# Patient Record
Sex: Male | Born: 1980 | Race: Black or African American | Hispanic: No | Marital: Married | Smoking: Never smoker
Health system: Southern US, Community
[De-identification: ages and names within clinical notes are randomized; demographics above are authoritative.]

## PROBLEM LIST (undated history)

## (undated) DIAGNOSIS — I10 Essential (primary) hypertension: Secondary | ICD-10-CM

## (undated) HISTORY — PX: KNEE SURGERY: SHX244

---

## 2008-11-12 ENCOUNTER — Emergency Department (HOSPITAL_BASED_OUTPATIENT_CLINIC_OR_DEPARTMENT_OTHER): Admission: EM | Admit: 2008-11-12 | Discharge: 2008-11-12 | Payer: Self-pay | Admitting: Emergency Medicine

## 2008-11-12 ENCOUNTER — Ambulatory Visit: Payer: Self-pay | Admitting: Diagnostic Radiology

## 2012-01-26 ENCOUNTER — Emergency Department (HOSPITAL_BASED_OUTPATIENT_CLINIC_OR_DEPARTMENT_OTHER)
Admission: EM | Admit: 2012-01-26 | Discharge: 2012-01-26 | Disposition: A | Discharge status: Home / Self Care | Payer: Self-pay | Attending: Emergency Medicine | Admitting: Emergency Medicine

## 2012-01-26 ENCOUNTER — Encounter (HOSPITAL_BASED_OUTPATIENT_CLINIC_OR_DEPARTMENT_OTHER): Payer: Self-pay

## 2012-01-26 DIAGNOSIS — M25469 Effusion, unspecified knee: Secondary | ICD-10-CM | POA: Insufficient documentation

## 2012-01-26 MED ORDER — IBUPROFEN 600 MG PO TABS: 600.0000 mg | ORAL_TABLET | Freq: Four times a day (QID) | ORAL | Status: DC | PRN

## 2012-01-26 MED ORDER — IBUPROFEN 400 MG PO TABS
600.0000 mg | ORAL_TABLET | Freq: Once | ORAL | Status: AC
  Administered 2012-01-26: 600 mg via ORAL
  Filled 2012-01-26: qty 1

## 2012-01-26 NOTE — ED Provider Notes (Signed)
Medical screening examination/treatment/procedure(s) were performed by non-physician practitioner and as supervising physician I was immediately available for consultation/collaboration.   Mikkel Charrette, MD 01/26/12 2346 

## 2012-01-26 NOTE — ED Notes (Signed)
C/o swelling to left knee after running-denies specific injury

## 2012-01-26 NOTE — ED Notes (Signed)
MD at bedside. 

## 2012-01-26 NOTE — ED Provider Notes (Signed)
History     CSN: [id number]  Arrival date & time 01/26/12  1815   First MD Initiated Contact with Patient 01/26/12 1830      Chief Complaint  Patient presents with  . Joint Swelling    (Consider location/radiation/quality/duration/timing/severity/associated sxs/prior treatment) HPI Comments: Patient presents with complaint of left knee swelling and minimal pain starting 2 days ago. Patient has a history of knee surgeries in the past. Patient states that he recently returned to jogging 2 days ago and noticed swelling gradually getting worse over the past 2 days. No treatments prior to arrival. Patient is ambulatory without much pain. Onset acute. Course is gradually worsening. Nothing makes symptoms better or worse. Patient denies numbness, weakness, or tingling in his leg.  The history is provided by the patient.    History reviewed. No pertinent past medical history.  History reviewed. No pertinent past surgical history.  No family history on file.  History  Substance Use Topics  . Smoking status: Never Smoker   . Smokeless tobacco: Not on file  . Alcohol Use: Yes      Review of Systems  Constitutional: Negative for activity change.  HENT: Negative for neck pain.   Musculoskeletal: Positive for joint swelling, arthralgias and gait problem. Negative for back pain.  Skin: Negative for wound.  Neurological: Negative for weakness and numbness.    Allergies  Other  Home Medications   Current Outpatient Rx  Name  Route  Sig  Dispense  Refill  . IBUPROFEN 600 MG PO TABS   Oral   Take 1 tablet (600 mg total) by mouth every 6 (six) hours as needed for pain.   20 tablet   0     BP 173/84  Pulse 77  Temp 98.6 F (37 C) (Oral)  Resp 16  Ht 5\' 8"  (1.727 m)  Wt 255 lb (115.667 kg)  BMI 38.77 kg/m2  SpO2 98%  Physical Exam  Nursing note and vitals reviewed. Constitutional: He appears well-developed and well-nourished.  HENT:  Head: Normocephalic and  atraumatic.  Eyes: Conjunctivae normal are normal.  Neck: Normal range of motion. Neck supple.  Cardiovascular: Normal pulses.   Musculoskeletal: He exhibits tenderness. He exhibits no edema.       Left hip: Normal.       Left knee: He exhibits effusion (small). He exhibits normal range of motion. no tenderness found.       Left ankle: Normal. no tenderness.  Neurological: He is alert. No sensory deficit.       Motor, sensation, and vascular distal to the injury is fully intact.   Skin: Skin is warm and dry.  Psychiatric: He has a normal mood and affect.    ED Course  Procedures (including critical care time)  Labs Reviewed - No data to display No results found.   1. Knee effusion    6:50 PM Patient seen and examined. Medications ordered.   Vital signs reviewed and are as follows: Filed Vitals:   01/26/12 1830  BP: 173/84  Pulse: 77  Temp: 98.6 F (37 C)  Resp: 16   Patient was counseled on RICE protocol and told to rest injury, use ice for no longer than 15 minutes every hour, compress the area, and elevate above the level of their heart as much as possible to reduce swelling.  Questions answered.  Patient verbalized understanding.    Knee sleeve per ortho.     MDM  Small knee effusion 2/2 recent return to activity.  RICE/NSAIDs indicated with graduated return to activity, ortho f/u if not improving or recurrent sx.         Renne Crigler, [place] 01/26/12 [phone]

## 2013-06-29 ENCOUNTER — Emergency Department (HOSPITAL_COMMUNITY)
Admission: EM | Admit: 2013-06-29 | Discharge: 2013-06-30 | Disposition: A | Discharge status: Home / Self Care | Payer: Self-pay | Attending: Emergency Medicine | Admitting: Emergency Medicine

## 2013-06-29 ENCOUNTER — Encounter (HOSPITAL_COMMUNITY): Payer: Self-pay | Admitting: Emergency Medicine

## 2013-06-29 DIAGNOSIS — R110 Nausea: Secondary | ICD-10-CM | POA: Insufficient documentation

## 2013-06-29 DIAGNOSIS — R5383 Other fatigue: Secondary | ICD-10-CM

## 2013-06-29 DIAGNOSIS — R5381 Other malaise: Secondary | ICD-10-CM | POA: Insufficient documentation

## 2013-06-29 DIAGNOSIS — I10 Essential (primary) hypertension: Secondary | ICD-10-CM | POA: Insufficient documentation

## 2013-06-29 DIAGNOSIS — R531 Weakness: Secondary | ICD-10-CM

## 2013-06-29 DIAGNOSIS — Z79899 Other long term (current) drug therapy: Secondary | ICD-10-CM | POA: Insufficient documentation

## 2013-06-29 NOTE — ED Notes (Signed)
Pt presents with c/o dizziness and nausea. Pt says he was fine earlier at work today and after drinking a red bull he started to feel jittery and dizzy. Pt also c.o nausea but no vomiting.

## 2013-06-30 MED ORDER — HYDROCHLOROTHIAZIDE 25 MG PO TABS: 25.0000 mg | ORAL_TABLET | Freq: Every day | ORAL | Status: DC

## 2013-06-30 NOTE — ED Provider Notes (Signed)
CSN: [id number]     Arrival date & time 06/29/13  2109 History   First MD Initiated Contact with Patient 06/29/13 2306     Chief Complaint  Patient presents with  . Dizziness  . Nausea     (Consider location/radiation/quality/duration/timing/severity/associated sxs/prior Treatment) HPI Comments: Pt with no medical hx comes in with cc of dizziness and nausea. Reports that after work, he was feeling tired, he took an energy drink, and since then he has been feeling jittery. Pt denies nausea, emesis, fevers, chills, chest pains, shortness of breath, headaches, abdominal pain, uti like symptoms. No substance abuse. No family hx of premature CAD, no palpitations. Pt has no URI like sx.   Patient is a 33 y.o. male presenting with dizziness. The history is provided by the patient.  Dizziness Associated symptoms: no chest pain and no shortness of breath     History reviewed. No pertinent past medical history. Past Surgical History  Procedure Laterality Date  . Knee surgery     History reviewed. No pertinent family history. History  Substance Use Topics  . Smoking status: Never Smoker   . Smokeless tobacco: Not on file  . Alcohol Use: Yes     Comment: daily     Review of Systems  Constitutional: Positive for fatigue. Negative for activity change and appetite change.  Respiratory: Negative for cough and shortness of breath.   Cardiovascular: Negative for chest pain.  Gastrointestinal: Negative for abdominal pain.  Genitourinary: Negative for dysuria.  Neurological: Positive for dizziness and weakness.      Allergies  Other  Home Medications   Current Outpatient Rx  Name  Route  Sig  Dispense  Refill  . acetaminophen (TYLENOL) 500 MG tablet   Oral   Take 1,000 mg by mouth every 8 (eight) hours as needed for mild pain or headache.         . loratadine (CLARITIN) 10 MG tablet   Oral   Take 10 mg by mouth daily as needed for allergies.         . hydrochlorothiazide  (HYDRODIURIL) 25 MG tablet   Oral   Take 1 tablet (25 mg total) by mouth daily.   30 tablet   1    BP 184/102  Pulse 80  Temp(Src) 98.2 F (36.8 C) (Oral)  Resp 16  SpO2 99% Physical Exam  Nursing note and vitals reviewed. Constitutional: He is oriented to person, place, and time. He appears well-developed.  HENT:  Head: Normocephalic and atraumatic.  Eyes: Conjunctivae and EOM are normal. Pupils are equal, round, and reactive to light.  Neck: Normal range of motion. Neck supple.  Cardiovascular: Normal rate and regular rhythm.   Pulmonary/Chest: Effort normal and breath sounds normal.  Abdominal: Soft. Bowel sounds are normal. He exhibits no distension. There is no tenderness. There is no rebound and no guarding.  Neurological: He is alert and oriented to person, place, and time.  Skin: Skin is warm.    ED Course  Procedures (including critical care time) Labs Review Labs Reviewed - No data to display Imaging Review No results found.   EKG Interpretation None      Date: 06/30/2013  Rate: 72  Rhythm: normal sinus rhythm  QRS Axis: normal  Intervals: normal  ST/T Wave abnormalities: normal  Conduction Disutrbances: none  Narrative Interpretation: unremarkable      MDM   Final diagnoses:  Hypertension  Weakness    Pt comes in with cc of weakness, nausea. No nausea  anymore. Still feels "jittery" no palpitations. Cardiac exam is normal. EKG is WNL.  Pt's BP is elevated. Kept him in the ER for a little longer, to see if this is due to the energy drink. However, BP is still high - still no cerebellar exam problems, ambulated well, and asymptomatic - so dont see any reason to get further workup. Stable fos d/c HCTZ started ,resources given for f/u.   Derwood Kaplan, MD 06/30/13 [phone]

## 2013-06-30 NOTE — Discharge Instructions (Signed)
We are not sure what is causing your symptoms. The workup in the ER is not complete, and is limited to screening for life threatening and emergent conditions only, so please see a primary care doctor for further evaluation. Take the BP meds as prescribed.  Check your BP frequently, see the primary care doctor as requested.  RESOURCE GUIDE  Chronic Pain Problems: Contact [facility]  [phone] Patients need to be referred by their primary care doctor.  Insufficient Money for Medicine: Contact [employer]:  call "211."   No Primary Care Doctor: - Call [facility] Connect  [phone] - can help you locate a primary care doctor that  accepts your insurance, provides certain services, etc. - Physician Referral Service- [phone]  Agencies that provide inexpensive medical care: - [facility]  [phone] - [facility]  [phone] - [facility]  [phone] - Women's Clinic  [phone] - [facility]  [phone] [facility]  [phone]  Medicaid-accepting [facility] Providers: - [facility]- [address]  [phone], Mon-Fri 9am-7pm, Sat 9am-1pm - [facility]- [address] [place], [address] [place]  [phone] - [facility]- [address] [place]  [phone] [facility]- [address]  [phone] - Renaye Rakers- [address] [place], [address], [phone]  Only accepts [place] patients after they have their name  applied to their card  Self Pay (no insurance) in [place]: - Sickle Cell Patients: Dr Willey Blade, [facility]  [address] [place], [phone] - [facility]- [address] [place]  [phone]       [facility] [place]- [address]       -     [facility]- see information above (Speak to [employer] if you do not have insurance)        -  [facility]- [address],  [phone]       -  [facility]- [address], [phone]       -  Dr Julio Sicks-  [address], [place], [phone]       -  [facility] - [address] [phone]       -  [facility]- [address], [phone], also [address] [phone]       -    [facility]- [address] [place], [phone], 1st & 3rd Saturday        every month, 10am-1pm  [facility] [address] [place], [place] [address] [phone]  [facility] [address] [place], [place] [address] [phone]  1) Find a Doctor and Pay Out of Pocket Although you won't have to find out who is covered by your insurance plan, it is a good idea to ask around and get recommendations. You will then need to call the office and see if the doctor you have chosen will accept you as a new patient and what types of options they offer for patients who are self-pay. Some doctors offer discounts or will set up payment plans for their patients who do not have insurance, but you will need to ask so you aren't surprised when you get to your  appointment.  2) Contact Your Local Health Department Not all health departments have doctors that can see patients for sick visits, but many do, so it is worth a call to see if yours does. If you don't know where your local health department is, you can check in your phone book. The CDC also has a tool to help you locate your state's health department, and many state websites also have listings of all of their local health departments.  3) Find a Walk-in Clinic If your illness is not likely to be very severe or complicated, you may want to try a walk in clinic. These are popping up all over the country in pharmacies, drugstores, and shopping centers. They're usually staffed by nurse practitioners or physician assistants that have  been trained to treat common illnesses and complaints. They're usually fairly quick and inexpensive. However, if you have serious medical issues or chronic medical problems, these are probably not your best option  STD Testing - [facility] [place], STD Clinic, [address], [place], phone [phone] or [phone].  Monday - Friday, call for an appointment. [facility] [place] [facility] [place], STD Clinic, [place], [place], phone [phone] or [phone].  Monday - Friday, call for an appointment.  Abuse/Neglect: [facility] [place] Child Abuse Hotline [phone] [facility] [place] Child Abuse Hotline [phone] (After Hours)  Emergency Shelter:  [facility] [phone]  Maternity Homes: - [facility] [phone] - [facility] [phone]  MRSA Hotline #:   231-673-7543(807) 524-8148  Dental Assistance If unable to pay or uninsured, contact:  [facility] to become qualified for the adult dental clinic.  Patients with Medicaid: [facility] [phone], [phone] [address], [phone]  If unable to pay, or uninsured, contact [facility] [phone] in [place], [phone] in [facility] [facility] [place]) to become qualified for the adult dental clinic  [facility] [address] [place], [place] [address] [phone] [email or web address]  Other [facility] Services: - [facility]- [address], [place], [place], [address], [phone], [address]. [phone], 2nd and 4th Thursday of the month at 6:30am.  10 clients each day by appointment, can sometimes see walk-in patients if someone does not show for an appointment. [facility]- [address], [place], [place], [address], [phone] - [facility]- [address], [place], [place], [phone],  [phone] [facility] [place] [facility]- [phone] [facility]- [phone] [phone]- [phone]         Hypertension As your heart beats, it forces blood through your arteries. This force is your blood pressure. If the pressure is too high, it is called hypertension (HTN) or high blood pressure. HTN is dangerous because you may have it and not know it. High blood pressure may mean that your heart has to work harder to pump blood. Your arteries may be narrow or stiff. The extra work puts you at risk for heart disease, stroke, and other problems.  Blood pressure consists of two numbers, a higher number over a lower, 110/72, for example. It is stated as "110 over 72." The ideal is below 120 for the top number (systolic) and under 80 for the bottom (diastolic). Write down your blood pressure today. You should pay close attention to your blood pressure if you have certain conditions  such as:  Heart failure.  Prior heart attack.  Diabetes  Chronic kidney disease.  Prior stroke.  Multiple risk factors for heart disease. To see if you have HTN, your blood pressure should be measured while you are seated with your arm held at the level of the heart. It should be measured at least twice. A one-time elevated blood pressure reading (especially in the Emergency Department) does not mean that you need treatment. There may be conditions in which the blood pressure is different between your right and left arms. It is important to see your caregiver soon for a recheck. Most people have essential hypertension which means that there is not a specific cause. This type of high blood pressure may be lowered by changing lifestyle factors such as:  Stress.  Smoking.  Lack of exercise.  Excessive weight.  Drug/tobacco/alcohol use.  Eating less salt. Most people do not have symptoms from high blood pressure until it has caused damage to the body.  Effective treatment can often prevent, delay or reduce that damage. TREATMENT  When a cause has been identified, treatment for high blood pressure is directed at the cause. There are a large number of medications to treat HTN. These fall into several categories, and your caregiver will help you select the medicines that are best for you. Medications may have side effects. You should review side effects with your caregiver. If your blood pressure stays high after you have made lifestyle changes or started on medicines,   Your medication(s) may need to be changed.  Other problems may need to be addressed.  Be certain you understand your prescriptions, and know how and when to take your medicine.  Be sure to follow up with your caregiver within the time frame advised (usually within two weeks) to have your blood pressure rechecked and to review your medications.  If you are taking more than one medicine to lower your blood pressure, make sure you know how and at what times they should be taken. Taking two medicines at the same time can result in blood pressure that is too low. SEEK IMMEDIATE MEDICAL CARE IF:  You develop a severe headache, blurred or changing vision, or confusion.  You have unusual weakness or numbness, or a faint feeling.  You have severe chest or abdominal pain, vomiting, or breathing problems. MAKE SURE YOU:   Understand these instructions.  Will watch your condition.  Will get help right away if you are not doing well or get worse. Document Released: 03/09/2005 Document Revised: 06/01/2011 Document Reviewed: 10/28/2007 [facility] Patient Information 2014 [place], [place].

## 2014-03-26 ENCOUNTER — Encounter (HOSPITAL_BASED_OUTPATIENT_CLINIC_OR_DEPARTMENT_OTHER): Payer: Self-pay

## 2014-03-26 ENCOUNTER — Emergency Department (HOSPITAL_BASED_OUTPATIENT_CLINIC_OR_DEPARTMENT_OTHER)
Admission: EM | Admit: 2014-03-26 | Discharge: 2014-03-26 | Disposition: A | Discharge status: Home / Self Care | Payer: Self-pay | Attending: Emergency Medicine | Admitting: Emergency Medicine

## 2014-03-26 DIAGNOSIS — R1084 Generalized abdominal pain: Secondary | ICD-10-CM

## 2014-03-26 DIAGNOSIS — I10 Essential (primary) hypertension: Secondary | ICD-10-CM | POA: Insufficient documentation

## 2014-03-26 HISTORY — DX: Essential (primary) hypertension: I10

## 2014-03-26 MED ORDER — GI COCKTAIL ~~LOC~~
30.0000 mL | Freq: Once | ORAL | Status: AC
  Administered 2014-03-26: 30 mL via ORAL
  Filled 2014-03-26: qty 30

## 2014-03-26 MED ORDER — HYDROCHLOROTHIAZIDE 25 MG PO TABS: 25.0000 mg | ORAL_TABLET | Freq: Every day | ORAL | Status: DC

## 2014-03-26 NOTE — ED Notes (Signed)
Pt reports 2 days ago with pain in epigastric area with radiation through abd, belching.  Stooled with relief but has been intermittent since this time.

## 2014-03-26 NOTE — ED Provider Notes (Signed)
CSN: [id number]     Arrival date & time 03/26/14  4098 History  This chart was scribed for Mirian Mo, MD by [place] Peace, ED Scribe. The patient was seen in MH06/MH06. The patient's care was started at 7:16 PM.    Chief Complaint  Patient presents with  . Abdominal Pain      Patient is a 34 y.o. male presenting with abdominal pain. The history is provided by the patient. No language interpreter was used.  Abdominal Pain Pain location:  Epigastric Pain quality: burning   Pain radiates to:  Chest and back Timing:  Intermittent Chronicity:  New Associated symptoms: chest pain   Associated symptoms: no constipation and no diarrhea     HPI Comments: Clevester Helzer is a 34 y.o. male who presents to the Emergency Department complaining of intermittent abdominal pain onset 2 days ago that he states "wasn't even pain." Pain is exacerbated at night. He describes pain as irritating, burning. Pt further adds pain radiates into his chest and experiences belching. Pt states he feels very "gassy". He denies history of similar pain. No complaints of diarrhea, constipation, or blood in his stools. History of hypertension.      Past Medical History  Diagnosis Date  . Hypertension    Past Surgical History  Procedure Laterality Date  . Knee surgery     No family history on file. History  Substance Use Topics  . Smoking status: Never Smoker   . Smokeless tobacco: Not on file  . Alcohol Use: Yes     Comment: occ    Review of Systems  Cardiovascular: Positive for chest pain.  Gastrointestinal: Positive for abdominal pain. Negative for diarrhea, constipation and blood in stool.  All other systems reviewed and are negative.     Allergies  Other  Home Medications   Prior to Admission medications   Medication Sig Start Date End Date Taking? Authorizing Provider  acetaminophen (TYLENOL) 500 MG tablet Take 1,000 mg by mouth every 8 (eight) hours as needed for mild pain or headache.     Historical Provider, MD  hydrochlorothiazide (HYDRODIURIL) 25 MG tablet Take 1 tablet (25 mg total) by mouth daily. 06/30/13   Derwood Kaplan, MD  loratadine (CLARITIN) 10 MG tablet Take 10 mg by mouth daily as needed for allergies.    Historical Provider, MD   BP 181/108 mmHg  Pulse 95  Temp(Src) 98.8 F (37.1 C) (Oral)  Resp 18  Ht  (1.753 m)  Wt 264 lb (119.75 kg)  BMI 38.97 kg/m2  SpO2 99% Physical Exam  Constitutional: He is oriented to person, place, and time. He appears well-developed and well-nourished.  HENT:  Head: Normocephalic and atraumatic.  Eyes: Conjunctivae and EOM are normal.  Neck: Normal range of motion. Neck supple.  Cardiovascular: Normal rate, regular rhythm and normal heart sounds.   Pulmonary/Chest: Effort normal and breath sounds normal. No respiratory distress.  Abdominal: He exhibits no distension. There is no tenderness. There is no rebound and no guarding.  Musculoskeletal: Normal range of motion.  Neurological: He is alert and oriented to person, place, and time.  Skin: Skin is warm and dry.  Vitals reviewed.   ED Course  Procedures (including critical care time) Labs Review Labs Reviewed - No data to display  Imaging Review No results found.   EKG Interpretation None      Medications - No data to display  7:20 PM- Treatment plan was discussed with patient who verbalizes understanding and agrees.  MDM   Final diagnoses:  None    34 y.o. male with pertinent PMH of HTN presents with abdominal discomfort as described above. Symptoms ongoing for the past 2 days, patient denies frank pain. No nausea, vomiting. Patient has had one episode of diarrhea. No fevers. On arrival today vitals signs and physical exam as above. Patient has no symptoms at time of my exam. Patient did describe symptoms that radiate up into his chest and back.  EKG obtained and unremarkable. Patient without risk factors with exception of hypertension. Suspect likely  reflux given her fairly nocturnal symptoms which improved with sitting up. Patient given GI cocktail and strict return precautions for chest pain or belly pain. Discharged home in stable condition.  I have reviewed all laboratory and imaging studies if ordered as above  1. Generalized abdominal pain       I personally performed the services described in this documentation, which was scribed in my presence. The recorded information has been reviewed and is accurate.   Mirian Mo, MD 03/26/14 2012

## 2014-03-26 NOTE — Discharge Instructions (Signed)
Abdominal Pain °Many things can cause abdominal pain. Usually, abdominal pain is not caused by a disease and will improve without treatment. It can often be observed and treated at home. Your health care provider will do a physical exam and possibly order blood tests and X-rays to help determine the seriousness of your pain. However, in many cases, more time must pass before a clear cause of the pain can be found. Before that point, your health care provider may not know if you need more testing or further treatment. °HOME CARE INSTRUCTIONS  °Monitor your abdominal pain for any changes. The following actions may help to alleviate any discomfort you are experiencing: °· Only take over-the-counter or prescription medicines as directed by your health care provider. °· Do not take laxatives unless directed to do so by your health care provider. °· Try a clear liquid diet (broth, tea, or water) as directed by your health care provider. Slowly move to a bland diet as tolerated. °SEEK MEDICAL CARE IF: °· You have unexplained abdominal pain. °· You have abdominal pain associated with nausea or diarrhea. °· You have pain when you urinate or have a bowel movement. °· You experience abdominal pain that wakes you in the night. °· You have abdominal pain that is worsened or improved by eating food. °· You have abdominal pain that is worsened with eating fatty foods. °· You have a fever. °SEEK IMMEDIATE MEDICAL CARE IF:  °· Your pain does not go away within 2 hours. °· You keep throwing up (vomiting). °· Your pain is felt only in portions of the abdomen, such as the right side or the left lower portion of the abdomen. °· You pass bloody or black tarry stools. °MAKE SURE YOU: °· Understand these instructions.   °· Will watch your condition.   °· Will get help right away if you are not doing well or get worse.   °Document Released: 12/17/2004 Document Revised: 03/14/2013 Document Reviewed: 11/16/2012 °ExitCare® Patient Information  ©2015 [employer]. This information is not intended to replace advice given to you by your health care provider. Make sure you discuss any questions you have with your health care provider. ° ° °Emergency Department Resource Guide °1) Find a Doctor and Pay Out of Pocket °Although you won't have to find out who is covered by your insurance plan, it is a good idea to ask around and get recommendations. You will then need to call the office and see if the doctor you have chosen will accept you as a new patient and what types of options they offer for patients who are self-pay. Some doctors offer discounts or will set up payment plans for their patients who do not have insurance, but you will need to ask so you aren't surprised when you get to your appointment. ° °2) Contact Your Local Health Department °Not all health departments have doctors that can see patients for sick visits, but many do, so it is worth a call to see if yours does. If you don't know where your local health department is, you can check in your phone book. The CDC also has a tool to help you locate your state's health department, and many state websites also have listings of all of their local health departments. ° °3) Find a Walk-in Clinic °If your illness is not likely to be very severe or complicated, you may want to try a walk in clinic. These are popping up all over the country in pharmacies, drugstores, and shopping centers. They're   usually staffed by nurse practitioners or physician assistants that have been trained to treat common illnesses and complaints. They're usually fairly quick and inexpensive. However, if you have serious medical issues or chronic medical problems, these are probably not your best option. ° °No Primary Care Doctor: °- Call [facility] at  832-8000 - they can help you locate a primary care doctor that  accepts your insurance, provides certain services, etc. °- Physician Referral Service- 1-800-533-3463 ° °Chronic  Pain Problems: °Organization         Address  Phone   Notes  °[facility]  (336) 297-2271 Patients need to be referred by their primary care doctor.  ° °Medication Assistance: °Organization         Address  Phone   Notes  °[place] [facility] [address] °[place], [place] [address] (336) 641-8030 --Must be a resident of [place] °-- Must have NO insurance coverage whatsoever (no Medicaid/ Medicare, etc.) °-- The pt. MUST have a primary care doctor that directs their care regularly and follows them in the community °  °[facility]  (866) 331-1348   °[employer]  (888) 892-1162   ° °Agencies that provide inexpensive medical care: °Organization         Address  Phone   Notes  °[facility]  (336) 832-8035   °[facility]    (336) 832-7272   °[facility] [address] °[place], [place] [address] (336) 832-4777   °[facility] [address], °[place] (336) 271-4999   °[facility]    (336) 373-0678   °[facility]    (336) 272-1050   °[facility] ° [address], [place] Phone:  (336) 832-4444, Fax:  (336) 832-4440 Hours of Operation:  9 am - 6 pm, M-F.  Also accepts Medicaid/Medicare and self-pay.  °[facility] ° [address], [place] Phone: (336) 832-3150, Fax: (336) 832-3151. Hours of Operation:  8:30 am - 5:30 pm, M-F.  Also accepts Medicaid and self-pay.  °[facility] [place] [address], [place] Phone: (336) 878-6027   °[facility] [address], [place], [place] (336)723-1848, Ext. 123 Mondays & Thursdays: 7-9 AM.  First 15 patients are seen on a first come, first serve basis. °  ° °Medicaid-accepting [place] Providers: ° °Organization         Address  Phone   Notes  °[facility] [address], [place] (336) 641-2100 Also  accepts self-pay patients.  °[facility] [address], [place] ° (336) 856-9996   °[facility] [address], [place] (336) 288-8857   °[facility] [address] [place] [address], [place] (336) 299-7000   °Veita Bland [address], [place]  ° (336) 373-1557 Only accepts [place] Access Medicaid patients after they have their name applied to their card.  ° °Self-Pay (no insurance) in [place]: ° °Organization         Address  Phone   Notes  °Sickle Cell Patients, [facility] [address], [place] (336) 832-1970   °[facility] [address], [place] (336) 832-4400   °[facility] ° [address], [place] (336) 992-4800   °[facility]/Dr. Osei-Bonsu ° [address] [place] [address], [place] or [address], [place] (336) 841-8500 Phone number for both [place] and [place] locations is the same.  °[facility] [address], [place] (336) 299-0000   °[facility] [address] [place] [address], [place] or [address] (336) 852-7530 °(336) 878-2260   °[facility] [address], [place] (336) 350-1642, phone; (336) 294-5005, fax Sees patients 1st and 3rd Saturday of every month.  Must not qualify for public or private insurance (i.e. Medicaid, Medicare, [place] Health Choice, Veterans' Benefits) • Household income should be no more than 200% of the poverty level •The clinic cannot treat you if you are pregnant or think you are pregnant • Sexually transmitted diseases are not treated at the clinic.  ° ° °Dental Care: °Organization         Address  Phone  Notes  °[facility] [address], [place] (336) 641-6152 Accepts children up to age 21 who are enrolled in Medicaid or [place] Health Choice; pregnant  women with a Medicaid card; and children who have applied for Medicaid or [place] Health Choice, but were declined, whose parents can pay a reduced fee at time of service.  °[facility] [place]  [address], [place] (336) 641-7733 Accepts children up to age 21 who are enrolled in Medicaid or [place] Health Choice; pregnant women with a Medicaid card; and children who have applied for Medicaid or [place] Health Choice, but were declined, whose parents can pay a reduced fee at time of service.  °[facility] ° [address], [place] (336) 641-4533 Patients are seen by appointment only. Walk-ins are not accepted. [facility] will see patients 18 years of age and older. °Monday - Tuesday (8am-5pm) °Most Wednesdays (8:30-5pm) °$30 per visit, cash only  °[facility] ° [address], [place] (336) 641-4533 Patients are seen by appointment only. Walk-ins are not accepted. [facility] will see patients 18 years of age and older. °One Wednesday Evening (Monthly: Volunteer Based).  $30 per visit, cash only  °[facility]  (919) 537-3737 for adults; Children under age 4, call Graduate Pediatric Dentistry at (919) [phone]. Children aged 4-14, please call (919) [phone] to request a pediatric application. ° Dental services are provided in all areas of dental care including fillings, crowns and bridges, complete and partial dentures, implants, gum treatment, root canals, and extractions. Preventive care is also provided. Treatment is provided to both adults and children. °Patients are selected via a lottery and there is often a waiting list. °  °[facility] [address], °[place] ° (336) 763-8833 [email or web address] °  °[facility] [address], [place], [place] (336)723-1848, Ext. 123 Second and Fourth Thursday of each month, opens at 6:30 AM; Clinic ends at 9 AM.  Patients are  seen on a first-come first-served basis, and a limited number are seen during each clinic.  ° °[facility] ° [address], [place],  (336) 723-7904   Eligibility Requirements °You must have lived in [place], [place], or [place] counties for at least the last three months. °  You cannot be eligible for state or federal sponsored healthcare insurance, including Veterans Administration, Medicaid, or Medicare. °  You generally cannot be eligible for healthcare insurance through   your employer.  °  How to apply: °Eligibility screenings are held every Tuesday and Wednesday afternoon from 1:00 pm until 4:00 pm. You do not need an appointment for the interview!  °[facility] [address], [place], [place] 336-631-2330   °[facility]  336-342-8273   °[facility]  336-703-3100   °[facility]  336-570-6415   ° °Behavioral Health Resources in the Community: °Intensive Outpatient Programs °Organization         Address  Phone  Notes  °[facility] [address], [place], [place] 336-878-6098   °[facility] [address], [place], [place] 336-832-9800   °[facility] [address], [place], [place] ° 336-882-2125   °[facility] [address],  °[place], [place] 1-800-853-5163 or 336-641-4981   °Substance Abuse Resources °Organization         Address  Phone  Notes  °[facility]  336-882-2125   °[facility]  336-784-9470   °[facility]  336-285-9073   °[facility]  336-845-3988   °Residential & Outpatient Substance Abuse Program  1-800-659-3381   °Psychological Services °Organization         Address  Phone  Notes  °[facility]  336- 832-9600   °[facility]  336- 378-7881   °[facility] [address], [place] 1-800-853-5163 or 336-641-4981   ° °Mobile Crisis  Teams °Organization         Address  Phone  Notes  °[facility], Mobile Crisis Care Unit  1-877-626-1772   °Assertive °[facility] ° [address] [place], [place] 336-834-9664   °Sharon DeEsch [address] °[place] 336-554-5454   ° °Self-Help/Support Groups °Organization         Address  Phone             Notes  °[facility] - variety of support groups  336- 373-1402 Call for more information  °Narcotics Anonymous (NA), [facility] [address], °[place]  2 meetings at this location  ° °Residential Treatment Programs °Organization         Address  Phone  Notes  °[facility] [address],    °[place]  1-866-801-8205   °[facility] ° [address], [place], [place] 704-293-8524   °[facility] [address], [place] 336-845-3988 Admissions: 8am-3pm M-F  °[facility] [address]    °[place], [place] 336-841-1104   °[facility] [address], [place], [place] 336-379-7146   °[facility] [address]  °[place], [place] 336-285-9073   °[facility] Intensive Outpatient [address], [place], [place] 336-852-3033   °[facility] [address]  °[place], [place] 1-877-615-2722 or 336-784-9470   °[facility] [address] [place], [place] 336-227-7417 Accepts Medicaid  °[facility] [address]  °[place] 1-800-659-3381 Substance Abuse/Addiction Treatment  ° °[place] Behavioral Health Resources °Organization         Address  Phone  Notes  °[facility]  (888) 581-9988   °Julie Brannon, PhD [address] [place], [place]   (336) 349-5553 or (336) 951-0000   °[facility]   [address] °[place], [place] (336) 349-4454   °  [facility] [address], [place], [place] (336) 342-8316  Insurance/Medicaid/sponsorship through [facility]  °[facility] [address]                                    [place], [place] (336) 342-8316 Therapy/tele-psych/case  °[facility] [address]  ° [place], [place] (336) 349-2233    °Dr. Arfeen  (336) 349-4544   °[facility] [place]  [employer] [place] [facility] 1) [address], [place] °2) [address], [place] °3)  [address], [place] (336) 349-3220 °(336) 342-7768 ° °(336) 342-8140   °[place] [facility] (336) 342-1394 or (336) 342-3537 (After Hours)    ° ° ° ° °

## 2014-10-29 ENCOUNTER — Ambulatory Visit (INDEPENDENT_AMBULATORY_CARE_PROVIDER_SITE_OTHER): Payer: Self-pay | Admitting: Family Medicine

## 2014-10-29 VITALS — BP 165/109 | HR 81 | Temp 98.3°F | Resp 18 | Ht 69.0 in | Wt 259.1 lb

## 2014-10-29 DIAGNOSIS — I10 Essential (primary) hypertension: Secondary | ICD-10-CM

## 2014-10-29 MED ORDER — AMLODIPINE BESYLATE 5 MG PO TABS: 5.0000 mg | ORAL_TABLET | Freq: Every day | ORAL | Status: DC

## 2014-10-29 MED ORDER — LISINOPRIL-HYDROCHLOROTHIAZIDE 10-12.5 MG PO TABS: 1.0000 | ORAL_TABLET | Freq: Every day | ORAL | Status: DC

## 2014-10-29 NOTE — Progress Notes (Signed)
  Subjective:  Patient ID: Antonio Rodgers, male    DOB: October 01, 1980  Age: 34 y.o. MRN: [id number]  34 year old male with a history of having been into the emergency room a couple of times and having had significantly elevated blood pressures, greater than 200 systolic and greater than 110 or 120 diastolic in the past. He doesn't have any symptoms. He just felt like he was time to get something take care of. He says he has been watching his diet and has lost about 25 pounds. He does a lot of regular exercise. He has been taking a fat burner. he does not smoke or drink. There is a family history of hypertension his mother. He does not know his father's history. He's not been any chest pains or shortness of breath or dizziness.   Objective:   Overweight very muscular young man. Neck supple without nodes or thyromegaly. Chest clear to auscultation. Heart regular without murmurs gallops or arrhythmias. No ankle edema.  Assessment & Plan:   Assessment:  Malignant essential hypertension Moderate obesity in a very muscular man  Plan:  Talk to him for a very long time, more than half of the visit was spent in counseling, about 15-20 minutes. Explained to him the need to pay attention to his health and his blood pressure and make lifestyle changes. He needs to take his medicine consistently and return faithfully. Breast understanding.  Patient Instructions  Eat less  Regular cardio exercise  Avoid excessive salt  Take the lisinopril HCTZ 10/12.5 one each morning  Take the amlodipine 5 mg 1 each evening  Set alarms on your phalangeal you remember to take medicines  Plan to return in about one month to get rechecked. Call the office and find out what Dr. Alwyn Ren schedule is and come in when I'm in the office in the descending and asked to see me. If you come in another time someone else will see you, but it is probably best to get consistent follow-up.  Go into a pharmacy or somewhere and document  her blood pressures every week or 2, keeping a record of them.    Antonio Apolinar, MD 10/29/2014

## 2014-10-29 NOTE — Patient Instructions (Signed)
Eat less  Regular cardio exercise  Avoid excessive salt  Take the lisinopril HCTZ 10/12.5 one each morning  Take the amlodipine 5 mg 1 each evening  Set alarms on your phalangeal you remember to take medicines  Plan to return in about one month to get rechecked. Call the office and find out what Dr. Alwyn Ren schedule is and come in when I'm in the office in the descending and asked to see me. If you come in another time someone else will see you, but it is probably best to get consistent follow-up.  Go into a pharmacy or somewhere and document her blood pressures every week or 2, keeping a record of them.

## 2014-10-30 LAB — COMPREHENSIVE METABOLIC PANEL
ALBUMIN: 4.6 g/dL (ref 3.6–5.1)
ALK PHOS: 53 U/L (ref 40–115)
ALT: 38 U/L (ref 9–46)
AST: 32 U/L (ref 10–40)
BUN: 14 mg/dL (ref 7–25)
CALCIUM: 9.3 mg/dL (ref 8.6–10.3)
CO2: 26 mmol/L (ref 20–31)
CREATININE: 0.95 mg/dL (ref 0.60–1.35)
Chloride: 104 mmol/L (ref 98–110)
GLUCOSE: 87 mg/dL (ref 65–99)
POTASSIUM: 4.3 mmol/L (ref 3.5–5.3)
SODIUM: 140 mmol/L (ref 135–146)
TOTAL PROTEIN: 7.4 g/dL (ref 6.1–8.1)
Total Bilirubin: 0.6 mg/dL (ref 0.2–1.2)

## 2014-10-31 ENCOUNTER — Encounter: Payer: Self-pay | Admitting: Family Medicine

## 2014-11-14 ENCOUNTER — Telehealth: Payer: Self-pay

## 2014-11-14 NOTE — Telephone Encounter (Signed)
Pt would like to speak with someone about changing his bp medicine or upping the dosage. Please call [phone]. He may be getting a side effect from the medicine

## 2014-11-15 ENCOUNTER — Telehealth: Payer: Self-pay

## 2014-11-15 NOTE — Telephone Encounter (Signed)
Patient returned call to speak with Callie. Advised that she has left for the day. Please call back asap

## 2014-11-15 NOTE — Telephone Encounter (Signed)
Left VM to call back 

## 2014-11-16 NOTE — Telephone Encounter (Signed)
Patient states he's still waiting on a phone call. He isn't able to take his blood pressure medication because the morning blood pressure pill raises his blood pressure. Patient needs advising. Please call!  [phone]

## 2014-11-17 NOTE — Telephone Encounter (Signed)
Yes this would be much more appropriate to figure out in clinic, especially with a current bp reading in clinic.

## 2014-11-17 NOTE — Telephone Encounter (Signed)
Advised pt to RTC on his voicemail.

## 2014-11-17 NOTE — Telephone Encounter (Signed)
Does pt need to RTC? 

## 2014-11-18 ENCOUNTER — Ambulatory Visit (INDEPENDENT_AMBULATORY_CARE_PROVIDER_SITE_OTHER): Payer: Self-pay | Admitting: Family Medicine

## 2014-11-18 VITALS — BP 160/98 | HR 69 | Temp 98.3°F | Resp 16 | Ht 69.5 in | Wt 253.6 lb

## 2014-11-18 DIAGNOSIS — I10 Essential (primary) hypertension: Secondary | ICD-10-CM | POA: Insufficient documentation

## 2014-11-18 MED ORDER — AMLODIPINE BESYLATE 10 MG PO TABS: 10.0000 mg | ORAL_TABLET | Freq: Every day | ORAL | Status: DC

## 2014-11-18 NOTE — Progress Notes (Signed)
° °  Subjective:    Patient ID: Antonio Rodgers, male    DOB: 1981/02/11, 34 y.o.   MRN: [id number] This chart was scribed for Elvina Sidle, MD by Littie Deeds, Medical Scribe. This patient was seen in Room 14 and the patient's care was started at 2:22 PM.   HPI HPI Comments: Antonio Rodgers is a 34 y.o. male who [facility] complaining of re-check of his blood pressure. He has been on blood pressure medications for the past year. Patient notes that the lisinopril-HCTZ he takes in the mornings does not make him feel well, so he stopped taking it about a week ago. FMHx includes HTN in his mother. Patient denies chest pain, SOB, leg swelling, and any other symptoms at this time.  Patient works with office furniture. There is some physical activity involved in the job.  Review of Systems  Respiratory: Negative for shortness of breath.   Cardiovascular: Negative for chest pain and leg swelling.       Objective:   Physical Exam CONSTITUTIONAL: Well developed/well nourished HEAD: Normocephalic/atraumatic EYES: EOM/PERRL ENMT: Mucous membranes moist NECK: supple no meningeal signs SPINE: entire spine nontender CV: S1/S2 noted, no murmurs/rubs/gallops noted. Repeat blood pressure: 150/95. LUNGS: Lungs are clear to auscultation bilaterally, no apparent distress ABDOMEN: soft, nontender, no rebound or guarding GU: no cva tenderness NEURO: Pt is awake/alert, moves all extremitiesx4 EXTREMITIES: pulses normal, full ROM SKIN: warm, color normal PSYCH: no abnormalities of mood noted      Assessment & Plan:   This chart was scribed in my presence and reviewed by me personally.    ICD-9-CM ICD-10-CM   1. Essential hypertension 401.9 I10      Signed, Elvina Sidle, MD

## 2014-11-18 NOTE — Patient Instructions (Signed)

## 2015-06-10 ENCOUNTER — Other Ambulatory Visit: Payer: Self-pay | Admitting: Family Medicine

## 2015-11-22 ENCOUNTER — Other Ambulatory Visit: Payer: Self-pay | Admitting: Family Medicine

## 2015-11-22 ENCOUNTER — Other Ambulatory Visit: Payer: Self-pay | Admitting: Emergency Medicine

## 2015-11-22 ENCOUNTER — Telehealth: Payer: Self-pay

## 2015-11-22 MED ORDER — AMLODIPINE BESYLATE 10 MG PO TABS: 10.0000 mg | ORAL_TABLET | Freq: Every day | ORAL | 0 refills | Status: DC

## 2015-11-22 NOTE — Telephone Encounter (Signed)
Patient request a refill of Amlodipine 10 MG. [facility] [place]  [phone].

## 2015-11-22 NOTE — Telephone Encounter (Signed)
Pt informed 30 day supply will be given this time only. He will need to schedule OV before RF's Medication [facility]

## 2015-12-23 ENCOUNTER — Other Ambulatory Visit: Payer: Self-pay | Admitting: Family Medicine

## 2015-12-31 ENCOUNTER — Telehealth: Payer: Self-pay

## 2015-12-31 NOTE — Telephone Encounter (Signed)
Patient just changed jobs and insurance has not kicked in yet.  He is requesting a 30 day supply of amLODipine (NORVASC) 10 MG tablet   [id number]

## 2016-01-03 NOTE — Telephone Encounter (Signed)
Last seen by Dr L in 10/2014. Please advise. We spoke to him last month to notify needed OV and gave 1 mos then.

## 2016-01-11 NOTE — Telephone Encounter (Signed)
We can give him medications to last until his insurance starts unless it is going to be more than 3 months - It is ok to give him medications through that time - please do not give him more than 3 months of medications - he must be seen before then

## 2016-01-13 MED ORDER — AMLODIPINE BESYLATE 10 MG PO TABS: 10.0000 mg | ORAL_TABLET | Freq: Every day | ORAL | 0 refills | Status: DC

## 2016-02-08 ENCOUNTER — Other Ambulatory Visit: Payer: Self-pay | Admitting: Physician Assistant

## 2016-03-07 ENCOUNTER — Other Ambulatory Visit: Payer: Self-pay | Admitting: Physician Assistant

## 2016-03-09 NOTE — Telephone Encounter (Signed)
See October note, needs ov for refills

## 2016-04-23 ENCOUNTER — Telehealth: Payer: Self-pay

## 2016-04-23 NOTE — Telephone Encounter (Signed)
Pt states that he is waiting for insurance to kick in the first of March. Currently out of BP medication. Has not been seen since 10/2014. Informed him that he will need a visit as it has been so long but wants to know if there is another option/asked me to put in a message.

## 2016-04-23 NOTE — Telephone Encounter (Signed)
Pt did not schedule at the time of phone call

## 2016-04-24 NOTE — Telephone Encounter (Signed)
11/16/14 last ov needs ov for refill

## 2016-04-28 ENCOUNTER — Ambulatory Visit (INDEPENDENT_AMBULATORY_CARE_PROVIDER_SITE_OTHER): Payer: BLUE CROSS/BLUE SHIELD | Admitting: Emergency Medicine

## 2016-04-28 ENCOUNTER — Ambulatory Visit: Payer: Self-pay

## 2016-04-28 VITALS — BP 136/98 | HR 85 | Temp 97.9°F | Resp 18 | Ht 69.5 in | Wt 271.0 lb

## 2016-04-28 DIAGNOSIS — I10 Essential (primary) hypertension: Secondary | ICD-10-CM | POA: Diagnosis not present

## 2016-04-28 MED ORDER — AMLODIPINE BESYLATE 10 MG PO TABS: 10.0000 mg | ORAL_TABLET | Freq: Every day | ORAL | 11 refills | Status: DC

## 2016-04-28 NOTE — Progress Notes (Signed)
Antonio Rodgers 36 y.o.   Chief Complaint  Patient presents with  . Follow-up    blood pressure    HISTORY OF PRESENT ILLNESS: This is a 36 y.o. male complaining of high blood pressure; asymptomatic and out of meds.  HPI   Prior to Admission medications   Medication Sig Start Date End Date Taking? Authorizing Provider  acetaminophen (TYLENOL) 500 MG tablet Take 1,000 mg by mouth every 8 (eight) hours as needed for mild pain or headache.    Historical Provider, MD  amLODipine (NORVASC) 10 MG tablet Take 1 tablet (10 mg total) by mouth daily. (patient will use up his 5 mg tabs at first) 04/28/16 05/28/16  Georgina QuintMiguel Jose Icholas Irby, MD    Allergies  Allergen Reactions  . Other Other (See Comments)    Family history of malignant hyperthermia under general anesthesia; pt himself has not undergone general anesthesia    Patient Active Problem List   Diagnosis Date Noted  . Essential hypertension 11/18/2014    Past Medical History:  Diagnosis Date  . Hypertension     Past Surgical History:  Procedure Laterality Date  . KNEE SURGERY      Social History   Social History  . Marital status: Single    Spouse name: N/A  . Number of children: N/A  . Years of education: N/A   Occupational History  . Not on file.   Social History Main Topics  . Smoking status: Never Smoker  . Smokeless tobacco: Never Used  . Alcohol use 0.0 oz/week     Comment: occ  . Drug use: No  . Sexual activity: Not on file   Other Topics Concern  . Not on file   Social History Narrative  . No narrative on file    History reviewed. No pertinent family history.   Review of Systems  Constitutional: Negative for chills, fever and malaise/fatigue.  HENT: Negative for congestion, nosebleeds and sore throat.   Eyes: Negative for blurred vision, double vision, discharge and redness.  Respiratory: Negative for cough, shortness of breath and wheezing.   Cardiovascular: Negative for chest pain, palpitations  and leg swelling.  Gastrointestinal: Negative for abdominal pain, diarrhea, nausea and vomiting.  Genitourinary: Negative for dysuria and hematuria.  Musculoskeletal: Negative for myalgias and neck pain.  Skin: Negative for rash.  Neurological: Negative for dizziness, speech change, focal weakness and headaches.  Endo/Heme/Allergies: Does not bruise/bleed easily.  All other systems reviewed and are negative.  Vitals:   04/28/16 1717  BP: (!) 136/98  Pulse: 85  Resp: 18  Temp: 97.9 F (36.6 C)     Physical Exam  Constitutional: He is oriented to person, place, and time. He appears well-developed and well-nourished.  HENT:  Head: Normocephalic and atraumatic.  Nose: Nose normal.  Mouth/Throat: Oropharynx is clear and moist.  Eyes: Conjunctivae and EOM are normal. Pupils are equal, round, and reactive to light.  Neck: Normal range of motion. Neck supple. No JVD present. No thyromegaly present.  Cardiovascular: Normal rate, regular rhythm, normal heart sounds and intact distal pulses.   Pulmonary/Chest: Effort normal and breath sounds normal.  Abdominal: Soft. He exhibits no distension. There is no tenderness.  Musculoskeletal: Normal range of motion.  Lymphadenopathy:    He has no cervical adenopathy.  Neurological: He is alert and oriented to person, place, and time. He displays normal reflexes. No sensory deficit. He exhibits normal muscle tone.  Skin: Skin is warm and dry.  Vitals reviewed.    ASSESSMENT &  PLAN: Kean was seen today for follow-up.  Diagnoses and all orders for this visit:  Essential hypertension  Other orders -     amLODipine (NORVASC) 10 MG tablet; Take 1 tablet (10 mg total) by mouth daily. (patient will use up his 5 mg tabs at first)    Patient Instructions       IF you received an x-ray today, you will receive an invoice from [facility]. Please contact [facility] at [phone] with questions or concerns regarding your  invoice.   IF you received labwork today, you will receive an invoice from [facility]. Please contact [facility] at [phone] with questions or concerns regarding your invoice.   Our billing staff will not be able to assist you with questions regarding bills from these companies.  You will be contacted with the lab results as soon as they are available. The fastest way to get your results is to activate your My Chart account. Instructions are located on the last page of this paperwork. If you have not heard from [place] regarding the results in 2 weeks, please contact this office.     Hypertension Hypertension is another name for high blood pressure. High blood pressure forces your heart to work harder to pump blood. A blood pressure reading has two numbers, which includes a higher number over a lower number (example: 110/72). Follow these instructions at home:  Have your blood pressure rechecked by your doctor.  Only take medicine as told by your doctor. Follow the directions carefully. The medicine does not work as well if you skip doses. Skipping doses also puts you at risk for problems.  Do not smoke.  Monitor your blood pressure at home as told by your doctor. Contact a doctor if:  You think you are having a reaction to the medicine you are taking.  You have repeat headaches or feel dizzy.  You have puffiness (swelling) in your ankles.  You have trouble with your vision. Get help right away if:  You get a very bad headache and are confused.  You feel weak, numb, or faint.  You get chest or belly (abdominal) pain.  You throw up (vomit).  You cannot breathe very well. This information is not intended to replace advice given to you by your health care provider. Make sure you discuss any questions you have with your health care provider. Document Released: 08/26/2007 Document Revised: 08/15/2015 Document Reviewed: 12/30/2012 Elsevier Interactive Patient Education  2017 Elsevier  Inc.      Edwina Barth, MD [facility]

## 2016-04-28 NOTE — Patient Instructions (Addendum)
     IF you received an x-ray today, you will receive an invoice from [facility]. Please contact [facility] at [phone] with questions or concerns regarding your invoice.   IF you received labwork today, you will receive an invoice from [facility]. Please contact [facility] at [phone] with questions or concerns regarding your invoice.   Our billing staff will not be able to assist you with questions regarding bills from these companies.  You will be contacted with the lab results as soon as they are available. The fastest way to get your results is to activate your My Chart account. Instructions are located on the last page of this paperwork. If you have not heard from us regarding the results in 2 weeks, please contact this office.      Hypertension Hypertension is another name for high blood pressure. High blood pressure forces your heart to work harder to pump blood. A blood pressure reading has two numbers, which includes a higher number over a lower number (example: 110/72). Follow these instructions at home:  Have your blood pressure rechecked by your doctor.  Only take medicine as told by your doctor. Follow the directions carefully. The medicine does not work as well if you skip doses. Skipping doses also puts you at risk for problems.  Do not smoke.  Monitor your blood pressure at home as told by your doctor. Contact a doctor if:  You think you are having a reaction to the medicine you are taking.  You have repeat headaches or feel dizzy.  You have puffiness (swelling) in your ankles.  You have trouble with your vision. Get help right away if:  You get a very bad headache and are confused.  You feel weak, numb, or faint.  You get chest or belly (abdominal) pain.  You throw up (vomit).  You cannot breathe very well. This information is not intended to replace advice given to you by your health care provider. Make sure you discuss any  questions you have with your health care provider. Document Released: 08/26/2007 Document Revised: 08/15/2015 Document Reviewed: 12/30/2012 Elsevier Interactive Patient Education  2017 Elsevier Inc.  

## 2017-02-17 ENCOUNTER — Encounter (HOSPITAL_COMMUNITY): Payer: Self-pay | Admitting: Family Medicine

## 2017-02-17 ENCOUNTER — Emergency Department (HOSPITAL_COMMUNITY)
Admission: EM | Admit: 2017-02-17 | Discharge: 2017-02-17 | Disposition: A | Discharge status: Home / Self Care | Payer: Self-pay | Attending: Emergency Medicine | Admitting: Emergency Medicine

## 2017-02-17 DIAGNOSIS — R531 Weakness: Secondary | ICD-10-CM | POA: Insufficient documentation

## 2017-02-17 DIAGNOSIS — I10 Essential (primary) hypertension: Secondary | ICD-10-CM | POA: Insufficient documentation

## 2017-02-17 DIAGNOSIS — Z79899 Other long term (current) drug therapy: Secondary | ICD-10-CM | POA: Insufficient documentation

## 2017-02-17 LAB — I-STAT CHEM 8, ED
BUN: 10 mg/dL (ref 6–20)
CALCIUM ION: 1.15 mmol/L (ref 1.15–1.40)
CHLORIDE: 101 mmol/L (ref 101–111)
CREATININE: 1 mg/dL (ref 0.61–1.24)
GLUCOSE: 103 mg/dL — AB (ref 65–99)
HCT: 46 % (ref 39.0–52.0)
Hemoglobin: 15.6 g/dL (ref 13.0–17.0)
POTASSIUM: 3.6 mmol/L (ref 3.5–5.1)
Sodium: 141 mmol/L (ref 135–145)
TCO2: 29 mmol/L (ref 22–32)

## 2017-02-17 NOTE — ED Triage Notes (Signed)
Patient is complaining of experiencing hypertension symptoms such as headache, dizziness, and "loses focus". Denies blurry vision and lightheadedness. Symptoms started "a few days ago". Patient has a history of hypertension and reports he has been taking medication as prescribed.

## 2017-02-17 NOTE — Discharge Instructions (Signed)

## 2017-02-17 NOTE — ED Provider Notes (Signed)
[facility]-EMERGENCY DEPT Provider Note   CSN: [id number] Arrival date & time: 02/17/17  0040     History   Chief Complaint Chief Complaint  Patient presents with  . Hypertension    HPI Antonio Rodgers is a 36 y.o. male.  The history is provided by the patient and the spouse.  Hypertension  This is a chronic problem. The current episode started more than 2 days ago. The problem occurs daily. The problem has been gradually worsening. Associated symptoms include headaches. Pertinent negatives include no chest pain, no abdominal pain and no shortness of breath. Nothing aggravates the symptoms. Nothing relieves the symptoms.  Patient reports history of hypertension, and he is currently managed on Norvasc 10 mg daily Denies any recent changes in medication He reports of the past 2-3 days has been developing mild lightheadedness He has mild headaches, and at times he is "losing focus " He reports seeing "spots" in his vision No visual loss or blindness He denies chest pain, shortness of breath, focal weakness  He reports that he was checking his phone in bed and googled his symptoms and he decided to be evaluated  Past Medical History:  Diagnosis Date  . Hypertension     Patient Active Problem List   Diagnosis Date Noted  . Essential hypertension 11/18/2014    Past Surgical History:  Procedure Laterality Date  . KNEE SURGERY         Home Medications    Prior to Admission medications   Medication Sig Start Date End Date Taking? Authorizing Provider  acetaminophen (TYLENOL) 500 MG tablet Take 1,000 mg by mouth every 8 (eight) hours as needed for mild pain or headache.   Yes [provider]  amLODipine (NORVASC) 10 MG tablet Take 1 tablet (10 mg total) by mouth daily. (patient will use up his 5 mg tabs at first) 04/28/16 02/17/17 Yes Sagardia, Eilleen KempfMiguel Jose, MD    Family History History reviewed. No pertinent family history.  Social  History Social History   Tobacco Use  . Smoking status: Never Smoker  . Smokeless tobacco: Never Used  Substance Use Topics  . Alcohol use: Yes    Alcohol/week: 0.0 oz    Comment: Weekends  . Drug use: No     Allergies   Other   Review of Systems Review of Systems  Constitutional: Negative for fever.  Respiratory: Negative for shortness of breath.   Cardiovascular: Negative for chest pain.  Gastrointestinal: Negative for abdominal pain.  Neurological: Positive for headaches. Negative for syncope.  All other systems reviewed and are negative.    Physical Exam Updated Vital Signs BP (!) 143/93   Pulse 87   Temp 98.4 F (36.9 C) (Oral)   Resp 18   Ht 1.753 m (5\' 9" )   Wt 120.2 kg (265 lb)   SpO2 100%   BMI 39.13 kg/m   Physical Exam CONSTITUTIONAL: Well developed/well nourished HEAD: Normocephalic/atraumatic EYES: EOMI/PERRL, no nystagmus, no ptosis ENMT: Mucous membranes moist NECK: supple no meningeal signs, no bruits SPINE/BACK:entire spine nontender CV: S1/S2 noted, no murmurs/rubs/gallops noted LUNGS: Lungs are clear to auscultation bilaterally, no apparent distress ABDOMEN: soft, nontender, no rebound or guarding GU:no cva tenderness NEURO:Awake/alert, face symmetric, no arm or leg drift is noted Equal 5/5 strength with shoulder abduction, elbow flex/extension, wrist flex/extension in upper extremities and equal hand grips bilaterally Equal 5/5 strength with hip flexion,knee flex/extension, foot dorsi/plantar flexion Cranial nerves 3/4/5/6/09/28/08/11/12 tested and intact Gait normal without ataxia No past  pointing Sensation to light touch intact in all extremities EXTREMITIES: pulses normal, full ROM SKIN: warm, color normal PSYCH: no abnormalities of mood noted, alert and oriented to situation    ED Treatments / Results  Labs (all labs ordered are listed, but only abnormal results are displayed) Labs Reviewed  I-STAT CHEM 8, ED - Abnormal;  Notable for the following components:      Result Value   Glucose, Bld 103 (*)    All other components within normal limits    EKG  EKG Interpretation  Date/Time:  Wednesday February 17 2017 02:00:42 EST Ventricular Rate:  80 PR Interval:    QRS Duration: 148 QT Interval:  416 QTC Calculation: 480 R Axis:   33 Text Interpretation:  Sinus rhythm Right bundle branch block Confirmed by Zadie RhineWickline, Davon Folta ([id number]) on 02/17/2017 2:12:02 AM       Radiology No results found.  Procedures Procedures (including critical care time)  Medications Ordered in ED Medications - No data to display   Initial Impression / Assessment and Plan / ED Course  I have reviewed the triage vital signs and the nursing notes.  Pertinent labs results that were available during my care of the patient were reviewed by me and considered in my medical decision making (see chart for details).     Patient well-appearing, no distress, no focal neuro deficit Blood pressure spontaneously improving while waiting in the ED We discussed possibility of starting new medication including HCTZ However he declines, he will continue Norvasc, and will follow up with PCP in 1 week Currently there is no focal signs of acute neurologic emergency No signs of hypertensive emergency We discussed strict return precautions  Final Clinical Impressions(s) / ED Diagnoses   Final diagnoses:  Essential hypertension  Weakness    ED Discharge Orders    None       Zadie RhineWickline, Avangelina Flight, MD 02/17/17 [phone]

## 2017-05-30 ENCOUNTER — Other Ambulatory Visit: Payer: Self-pay | Admitting: Emergency Medicine

## 2017-05-31 NOTE — Telephone Encounter (Signed)
Refill request for Amlodipine / Request already handled: Medication Detail    Disp Refills Start End   amLODipine (NORVASC) 10 MG tablet [Pharmacy Med Name: AMLODIPINE BESYLATE 10 MG TAB] 30 tablet 10 05/30/2017    Sig - Route: Take 1 tablet (10 mg total) by mouth daily. (patient will use up his 5 mg tabs at first) - Oral

## 2017-06-24 ENCOUNTER — Other Ambulatory Visit: Payer: Self-pay | Admitting: Emergency Medicine

## 2017-06-24 NOTE — Telephone Encounter (Signed)
Copied from CRM [phone]#[id number]. Topic: General - Other >> Jun 24, 2017  3:28 PM Percival SpanishKennedy, Cheryl W wrote:  The below med is showing that it has expired and it is also saying pt has refills. This nned to be corrected so that pt can get his med refill   Pharmacy [facility] [address]

## 2017-06-24 NOTE — Telephone Encounter (Addendum)
Amlodipine (needs new prescription sent; expired); Patient is out of medication  Last OV: 04/28/16; Upcoming CPE 10/27/17 as noted to return in 6 months that was not kept and patient says he needs time to come up with the money to pay for the appointment due to no employment at present.  PCP: Sag[facility]ia [id number] [place], [place] - [address] [phone] (Phone) [phone] (Fax)

## 2017-06-25 NOTE — Telephone Encounter (Signed)
Left voice mail message in mobile, HIPPA  reviewed. He needs to schedule appointment for additional refills and reevaluate blood pressure. Also, he had eleven additional refill that he did not refill because of employment.

## 2017-06-29 ENCOUNTER — Ambulatory Visit: Payer: BLUE CROSS/BLUE SHIELD | Admitting: Emergency Medicine

## 2017-06-30 ENCOUNTER — Ambulatory Visit (INDEPENDENT_AMBULATORY_CARE_PROVIDER_SITE_OTHER): Payer: Self-pay | Admitting: Emergency Medicine

## 2017-06-30 ENCOUNTER — Encounter: Payer: Self-pay | Admitting: Emergency Medicine

## 2017-06-30 VITALS — BP 144/86 | HR 73 | Temp 98.2°F | Resp 17 | Ht 69.0 in | Wt 280.0 lb

## 2017-06-30 DIAGNOSIS — I10 Essential (primary) hypertension: Secondary | ICD-10-CM

## 2017-06-30 MED ORDER — AMLODIPINE BESYLATE 10 MG PO TABS: 10.0000 mg | ORAL_TABLET | Freq: Every day | ORAL | 3 refills | Status: DC

## 2017-06-30 NOTE — Patient Instructions (Addendum)
   IF you received an x-ray today, you will receive an invoice from [facility]. Please contact [facility] at [phone] with questions or concerns regarding your invoice.   IF you received labwork today, you will receive an invoice from [facility]. Please contact [facility] at [phone] with questions or concerns regarding your invoice.   Our billing staff will not be able to assist you with questions regarding bills from these companies.  You will be contacted with the lab results as soon as they are available. The fastest way to get your results is to activate your My Chart account. Instructions are located on the last page of this paperwork. If you have not heard from us regarding the results in 2 weeks, please contact this office.     Hypertension Hypertension, commonly called high blood pressure, is when the force of blood pumping through the arteries is too strong. The arteries are the blood vessels that carry blood from the heart throughout the body. Hypertension forces the heart to work harder to pump blood and may cause arteries to become narrow or stiff. Having untreated or uncontrolled hypertension can cause heart attacks, strokes, kidney disease, and other problems. A blood pressure reading consists of a higher number over a lower number. Ideally, your blood pressure should be below 120/80. The first ("top") number is called the systolic pressure. It is a measure of the pressure in your arteries as your heart beats. The second ("bottom") number is called the diastolic pressure. It is a measure of the pressure in your arteries as the heart relaxes. What are the causes? The cause of this condition is not known. What increases the risk? Some risk factors for high blood pressure are under your control. Others are not. Factors you can change  Smoking.  Having type 2 diabetes mellitus, high cholesterol, or both.  Not getting enough exercise or physical  activity.  Being overweight.  Having too much fat, sugar, calories, or salt (sodium) in your diet.  Drinking too much alcohol. Factors that are difficult or impossible to change  Having chronic kidney disease.  Having a family history of high blood pressure.  Age. Risk increases with age.  Race. You may be at higher risk if you are African-American.  Gender. Men are at higher risk than women before age 45. After age 65, women are at higher risk than men.  Having obstructive sleep apnea.  Stress. What are the signs or symptoms? Extremely high blood pressure (hypertensive crisis) may cause:  Headache.  Anxiety.  Shortness of breath.  Nosebleed.  Nausea and vomiting.  Severe chest pain.  Jerky movements you cannot control (seizures).  How is this diagnosed? This condition is diagnosed by measuring your blood pressure while you are seated, with your arm resting on a surface. The cuff of the blood pressure monitor will be placed directly against the skin of your upper arm at the level of your heart. It should be measured at least twice using the same arm. Certain conditions can cause a difference in blood pressure between your right and left arms. Certain factors can cause blood pressure readings to be lower or higher than normal (elevated) for a short period of time:  When your blood pressure is higher when you are in a health care provider's office than when you are at home, this is called white coat hypertension. Most people with this condition do not need medicines.  When your blood pressure is higher at home than when you   are in a health care provider's office, this is called masked hypertension. Most people with this condition may need medicines to control blood pressure.  If you have a high blood pressure reading during one visit or you have normal blood pressure with other risk factors:  You may be asked to return on a different day to have your blood pressure  checked again.  You may be asked to monitor your blood pressure at home for 1 week or longer.  If you are diagnosed with hypertension, you may have other blood or imaging tests to help your health care provider understand your overall risk for other conditions. How is this treated? This condition is treated by making healthy lifestyle changes, such as eating healthy foods, exercising more, and reducing your alcohol intake. Your health care provider may prescribe medicine if lifestyle changes are not enough to get your blood pressure under control, and if:  Your systolic blood pressure is above 130.  Your diastolic blood pressure is above 80.  Your personal target blood pressure may vary depending on your medical conditions, your age, and other factors. Follow these instructions at home: Eating and drinking  Eat a diet that is high in fiber and potassium, and low in sodium, added sugar, and fat. An example eating plan is called the DASH (Dietary Approaches to Stop Hypertension) diet. To eat this way: ? Eat plenty of fresh fruits and vegetables. Try to fill half of your plate at each meal with fruits and vegetables. ? Eat whole grains, such as whole wheat pasta, brown rice, or whole grain bread. Fill about one quarter of your plate with whole grains. ? Eat or drink low-fat dairy products, such as skim milk or low-fat yogurt. ? Avoid fatty cuts of meat, processed or cured meats, and poultry with skin. Fill about one quarter of your plate with lean proteins, such as fish, chicken without skin, beans, eggs, and tofu. ? Avoid premade and processed foods. These tend to be higher in sodium, added sugar, and fat.  Reduce your daily sodium intake. Most people with hypertension should eat less than 1,500 mg of sodium a day.  Limit alcohol intake to no more than 1 drink a day for nonpregnant women and 2 drinks a day for men. One drink equals 12 oz of beer, 5 oz of wine, or 1 oz of hard  liquor. Lifestyle  Work with your health care provider to maintain a healthy body weight or to lose weight. Ask what an ideal weight is for you.  Get at least 30 minutes of exercise that causes your heart to beat faster (aerobic exercise) most days of the week. Activities may include walking, swimming, or biking.  Include exercise to strengthen your muscles (resistance exercise), such as pilates or lifting weights, as part of your weekly exercise routine. Try to do these types of exercises for 30 minutes at least 3 days a week.  Do not use any products that contain nicotine or tobacco, such as cigarettes and e-cigarettes. If you need help quitting, ask your health care provider.  Monitor your blood pressure at home as told by your health care provider.  Keep all follow-up visits as told by your health care provider. This is important. Medicines  Take over-the-counter and prescription medicines only as told by your health care provider. Follow directions carefully. Blood pressure medicines must be taken as prescribed.  Do not skip doses of blood pressure medicine. Doing this puts you at risk for problems and   can make the medicine less effective.  Ask your health care provider about side effects or reactions to medicines that you should watch for. Contact a health care provider if:  You think you are having a reaction to a medicine you are taking.  You have headaches that keep coming back (recurring).  You feel dizzy.  You have swelling in your ankles.  You have trouble with your vision. Get help right away if:  You develop a severe headache or confusion.  You have unusual weakness or numbness.  You feel faint.  You have severe pain in your chest or abdomen.  You vomit repeatedly.  You have trouble breathing. Summary  Hypertension is when the force of blood pumping through your arteries is too strong. If this condition is not controlled, it may put you at risk for serious  complications.  Your personal target blood pressure may vary depending on your medical conditions, your age, and other factors. For most people, a normal blood pressure is less than 120/80.  Hypertension is treated with lifestyle changes, medicines, or a combination of both. Lifestyle changes include weight loss, eating a healthy, low-sodium diet, exercising more, and limiting alcohol. This information is not intended to replace advice given to you by your health care provider. Make sure you discuss any questions you have with your health care provider. Document Released: 03/09/2005 Document Revised: 02/05/2016 Document Reviewed: 02/05/2016 Elsevier Interactive Patient Education  2018 Elsevier Inc.  

## 2017-06-30 NOTE — Progress Notes (Signed)
Antonio Rodgers 37 y.o.   Chief Complaint  Patient presents with  . Follow-up    HBP    HISTORY OF PRESENT ILLNESS: This is a 37 y.o. male With a history of hypertension here for follow-up visit.  Ran out of amlodipine.  Asymptomatic.  No complaints.  HPI   Prior to Admission medications   Medication Sig Start Date End Date Taking? Authorizing Provider  amLODipine (NORVASC) 10 MG tablet Take 1 tablet (10 mg total) by mouth daily. (patient will use up his 5 mg tabs at first) Patient not taking: Reported on 06/30/2017 04/28/16 06/30/17  Georgina QuintSagardia, Roxsana Riding Jose, MD    Allergies  Allergen Reactions  . Other Other (See Comments)    Family history of malignant hyperthermia under general anesthesia; pt himself has not undergone general anesthesia    Patient Active Problem List   Diagnosis Date Noted  . Essential hypertension 11/18/2014    Past Medical History:  Diagnosis Date  . Hypertension     Past Surgical History:  Procedure Laterality Date  . KNEE SURGERY      Social History   Socioeconomic History  . Marital status: Single    Spouse name: Not on file  . Number of children: Not on file  . Years of education: Not on file  . Highest education level: Not on file  Occupational History  . Not on file  Social Needs  . Financial resource strain: Not on file  . Food insecurity:    Worry: Not on file    Inability: Not on file  . Transportation needs:    Medical: Not on file    Non-medical: Not on file  Tobacco Use  . Smoking status: Never Smoker  . Smokeless tobacco: Never Used  Substance and Sexual Activity  . Alcohol use: Yes    Alcohol/week: 0.0 oz    Comment: Weekends  . Drug use: No  . Sexual activity: Not on file  Lifestyle  . Physical activity:    Days per week: Not on file    Minutes per session: Not on file  . Stress: Not on file  Relationships  . Social connections:    Talks on phone: Not on file    Gets together: Not on file    Attends religious  service: Not on file    Active member of club or organization: Not on file    Attends meetings of clubs or organizations: Not on file    Relationship status: Not on file  . Intimate partner violence:    Fear of current or ex partner: Not on file    Emotionally abused: Not on file    Physically abused: Not on file    Forced sexual activity: Not on file  Other Topics Concern  . Not on file  Social History Narrative  . Not on file    No family history on file.   Review of Systems  Constitutional: Negative.  Negative for chills, fever and weight loss.  HENT: Negative.  Negative for sore throat.   Eyes: Negative.  Negative for discharge and redness.  Respiratory: Negative for cough, hemoptysis and shortness of breath.   Cardiovascular: Negative.  Negative for chest pain and palpitations.  Gastrointestinal: Negative.  Negative for abdominal pain, diarrhea, nausea and vomiting.  Genitourinary: Negative.   Musculoskeletal: Negative.  Negative for back pain, myalgias and neck pain.  Skin: Negative.  Negative for rash.  Neurological: Negative.  Negative for dizziness and headaches.  Endo/Heme/Allergies: Negative.  All other systems reviewed and are negative.   Vitals:   06/30/17 1613  BP: (!) 144/86  Pulse: 73  Resp: 17  Temp: 98.2 F (36.8 C)  SpO2: 98%    Physical Exam  Constitutional: He is oriented to person, place, and time. He appears well-developed and well-nourished.  HENT:  Head: Normocephalic and atraumatic.  Nose: Nose normal.  Mouth/Throat: Oropharynx is clear and moist.  Eyes: Pupils are equal, round, and reactive to light. Conjunctivae and EOM are normal.  Neck: Normal range of motion. Neck supple. No JVD present.  Cardiovascular: Normal rate, regular rhythm and normal heart sounds.  Pulmonary/Chest: Effort normal and breath sounds normal.  Abdominal: Soft. There is no tenderness.  Musculoskeletal: Normal range of motion.  Neurological: He is alert and  oriented to person, place, and time. No sensory deficit. He exhibits normal muscle tone.  Skin: Skin is warm and dry. Capillary refill takes less than 2 seconds. No rash noted.  Psychiatric: He has a normal mood and affect. His behavior is normal.  Vitals reviewed.    ASSESSMENT & PLAN: Abdulkarim was seen today for follow-up.  Diagnoses and all orders for this visit:  Essential hypertension -     amLODipine (NORVASC) 10 MG tablet; Take 1 tablet (10 mg total) by mouth daily.    Patient Instructions       IF you received an x-ray today, you will receive an invoice from [facility]. Please contact [facility] at [phone] with questions or concerns regarding your invoice.   IF you received labwork today, you will receive an invoice from [facility]. Please contact [facility] at [phone] with questions or concerns regarding your invoice.   Our billing staff will not be able to assist you with questions regarding bills from these companies.  You will be contacted with the lab results as soon as they are available. The fastest way to get your results is to activate your My Chart account. Instructions are located on the last page of this paperwork. If you have not heard from [place] regarding the results in 2 weeks, please contact this office.      Hypertension Hypertension, commonly called high blood pressure, is when the force of blood pumping through the arteries is too strong. The arteries are the blood vessels that carry blood from the heart throughout the body. Hypertension forces the heart to work harder to pump blood and may cause arteries to become narrow or stiff. Having untreated or uncontrolled hypertension can cause heart attacks, strokes, kidney disease, and other problems. A blood pressure reading consists of a higher number over a lower number. Ideally, your blood pressure should be below 120/80. The first ("top") number is called the systolic pressure. It is a  measure of the pressure in your arteries as your heart beats. The second ("bottom") number is called the diastolic pressure. It is a measure of the pressure in your arteries as the heart relaxes. What are the causes? The cause of this condition is not known. What increases the risk? Some risk factors for high blood pressure are under your control. Others are not. Factors you can change  Smoking.  Having type 2 diabetes mellitus, high cholesterol, or both.  Not getting enough exercise or physical activity.  Being overweight.  Having too much fat, sugar, calories, or salt (sodium) in your diet.  Drinking too much alcohol. Factors that are difficult or impossible to change  Having chronic kidney disease.  Having a family history of high  blood pressure.  Age. Risk increases with age.  Race. You may be at higher risk if you are African-American.  Gender. Men are at higher risk than women before age 25. After age 65, women are at higher risk than men.  Having obstructive sleep apnea.  Stress. What are the signs or symptoms? Extremely high blood pressure (hypertensive crisis) may cause:  Headache.  Anxiety.  Shortness of breath.  Nosebleed.  Nausea and vomiting.  Severe chest pain.  Jerky movements you cannot control (seizures).  How is this diagnosed? This condition is diagnosed by measuring your blood pressure while you are seated, with your arm resting on a surface. The cuff of the blood pressure monitor will be placed directly against the skin of your upper arm at the level of your heart. It should be measured at least twice using the same arm. Certain conditions can cause a difference in blood pressure between your right and left arms. Certain factors can cause blood pressure readings to be lower or higher than normal (elevated) for a short period of time:  When your blood pressure is higher when you are in a health care provider's office than when you are at home,  this is called white coat hypertension. Most people with this condition do not need medicines.  When your blood pressure is higher at home than when you are in a health care provider's office, this is called masked hypertension. Most people with this condition may need medicines to control blood pressure.  If you have a high blood pressure reading during one visit or you have normal blood pressure with other risk factors:  You may be asked to return on a different day to have your blood pressure checked again.  You may be asked to monitor your blood pressure at home for 1 week or longer.  If you are diagnosed with hypertension, you may have other blood or imaging tests to help your health care provider understand your overall risk for other conditions. How is this treated? This condition is treated by making healthy lifestyle changes, such as eating healthy foods, exercising more, and reducing your alcohol intake. Your health care provider may prescribe medicine if lifestyle changes are not enough to get your blood pressure under control, and if:  Your systolic blood pressure is above 130.  Your diastolic blood pressure is above 80.  Your personal target blood pressure may vary depending on your medical conditions, your age, and other factors. Follow these instructions at home: Eating and drinking  Eat a diet that is high in fiber and potassium, and low in sodium, added sugar, and fat. An example eating plan is called the DASH (Dietary Approaches to Stop Hypertension) diet. To eat this way: ? Eat plenty of fresh fruits and vegetables. Try to fill half of your plate at each meal with fruits and vegetables. ? Eat whole grains, such as whole wheat pasta, brown rice, or whole grain bread. Fill about one quarter of your plate with whole grains. ? Eat or drink low-fat dairy products, such as skim milk or low-fat yogurt. ? Avoid fatty cuts of meat, processed or cured meats, and poultry with skin.  Fill about one quarter of your plate with lean proteins, such as fish, chicken without skin, beans, eggs, and tofu. ? Avoid premade and processed foods. These tend to be higher in sodium, added sugar, and fat.  Reduce your daily sodium intake. Most people with hypertension should eat less than 1,500 mg of sodium  a day.  Limit alcohol intake to no more than 1 drink a day for nonpregnant women and 2 drinks a day for men. One drink equals 12 oz of beer, 5 oz of wine, or 1 oz of hard liquor. Lifestyle  Work with your health care provider to maintain a healthy body weight or to lose weight. Ask what an ideal weight is for you.  Get at least 30 minutes of exercise that causes your heart to beat faster (aerobic exercise) most days of the week. Activities may include walking, swimming, or biking.  Include exercise to strengthen your muscles (resistance exercise), such as pilates or lifting weights, as part of your weekly exercise routine. Try to do these types of exercises for 30 minutes at least 3 days a week.  Do not use any products that contain nicotine or tobacco, such as cigarettes and e-cigarettes. If you need help quitting, ask your health care provider.  Monitor your blood pressure at home as told by your health care provider.  Keep all follow-up visits as told by your health care provider. This is important. Medicines  Take over-the-counter and prescription medicines only as told by your health care provider. Follow directions carefully. Blood pressure medicines must be taken as prescribed.  Do not skip doses of blood pressure medicine. Doing this puts you at risk for problems and can make the medicine less effective.  Ask your health care provider about side effects or reactions to medicines that you should watch for. Contact a health care provider if:  You think you are having a reaction to a medicine you are taking.  You have headaches that keep coming back (recurring).  You  feel dizzy.  You have swelling in your ankles.  You have trouble with your vision. Get help right away if:  You develop a severe headache or confusion.  You have unusual weakness or numbness.  You feel faint.  You have severe pain in your chest or abdomen.  You vomit repeatedly.  You have trouble breathing. Summary  Hypertension is when the force of blood pumping through your arteries is too strong. If this condition is not controlled, it may put you at risk for serious complications.  Your personal target blood pressure may vary depending on your medical conditions, your age, and other factors. For most people, a normal blood pressure is less than 120/80.  Hypertension is treated with lifestyle changes, medicines, or a combination of both. Lifestyle changes include weight loss, eating a healthy, low-sodium diet, exercising more, and limiting alcohol. This information is not intended to replace advice given to you by your health care provider. Make sure you discuss any questions you have with your health care provider. Document Released: 03/09/2005 Document Revised: 02/05/2016 Document Reviewed: 02/05/2016 Elsevier Interactive Patient Education  2018 Elsevier Inc.      Edwina Barth, MD [facility]

## 2017-08-12 ENCOUNTER — Encounter: Payer: Self-pay | Admitting: Family Medicine

## 2017-10-26 ENCOUNTER — Encounter: Payer: Self-pay | Admitting: Emergency Medicine

## 2017-10-27 ENCOUNTER — Encounter: Payer: Self-pay | Admitting: Emergency Medicine

## 2017-12-31 ENCOUNTER — Ambulatory Visit: Payer: Self-pay | Admitting: Emergency Medicine

## 2018-01-16 ENCOUNTER — Emergency Department (HOSPITAL_BASED_OUTPATIENT_CLINIC_OR_DEPARTMENT_OTHER): Payer: Self-pay

## 2018-01-16 ENCOUNTER — Encounter (HOSPITAL_BASED_OUTPATIENT_CLINIC_OR_DEPARTMENT_OTHER): Payer: Self-pay | Admitting: Emergency Medicine

## 2018-01-16 ENCOUNTER — Emergency Department (HOSPITAL_BASED_OUTPATIENT_CLINIC_OR_DEPARTMENT_OTHER)
Admission: EM | Admit: 2018-01-16 | Discharge: 2018-01-16 | Disposition: A | Discharge status: Home / Self Care | Payer: Self-pay | Attending: Emergency Medicine | Admitting: Emergency Medicine

## 2018-01-16 ENCOUNTER — Other Ambulatory Visit: Payer: Self-pay

## 2018-01-16 DIAGNOSIS — M25562 Pain in left knee: Secondary | ICD-10-CM | POA: Insufficient documentation

## 2018-01-16 DIAGNOSIS — I10 Essential (primary) hypertension: Secondary | ICD-10-CM | POA: Insufficient documentation

## 2018-01-16 MED ORDER — IBUPROFEN 800 MG PO TABS: 800.0000 mg | ORAL_TABLET | Freq: Three times a day (TID) | ORAL | 0 refills | Status: DC | PRN

## 2018-01-16 NOTE — Discharge Instructions (Signed)
We believe that your symptoms are caused by musculoskeletal strain but you also have evidence of ware-and-tare of the knee joint on x-ray.   Please read through the included information about additional care such as heating pads, over-the-counter pain medicine.  If you were provided a prescription please use it only as needed and as instructed.  Remember that early mobility and using the affected part of your body is actually better than keeping it immobile.  Follow-up with the doctor listed as recommended or return to the emergency department with new or worsening symptoms that concern you.

## 2018-01-16 NOTE — ED Triage Notes (Signed)
L knee pain x 1 week after bowling.

## 2018-01-16 NOTE — ED Provider Notes (Signed)
Emergency Department Provider Note   I have reviewed the triage vital signs and the nursing notes.   HISTORY  Chief Complaint Knee Pain   HPI Antonio Rodgers is a 37 y.o. male presents to the ED with left knee pain. Patient has had pain and meniscus injury in this knee many years prior. No issues until 1 week ago when he began experiencing severe pain. No fever or chills. Able to walk but with a limp. No radiation of symptoms. Pain worse with movement. No ankle or hip pain. No obvious injury but symptoms did start the day after bowling.   Past Medical History:  Diagnosis Date  . Hypertension     Patient Active Problem List   Diagnosis Date Noted  . Essential hypertension 11/18/2014    Past Surgical History:  Procedure Laterality Date  . KNEE SURGERY     Allergies Other  No family history on file.  Social History Social History   Tobacco Use  . Smoking status: Never Smoker  . Smokeless tobacco: Never Used  Substance Use Topics  . Alcohol use: Yes    Alcohol/week: 0.0 standard drinks    Comment: Weekends  . Drug use: No    Review of Systems  Constitutional: No fever/chills Eyes: No visual changes. ENT: No sore throat. Cardiovascular: Denies chest pain. Respiratory: Denies shortness of breath. Gastrointestinal: No abdominal pain.  No nausea, no vomiting.  No diarrhea.  No constipation. Genitourinary: Negative for dysuria. Musculoskeletal: Negative for back pain. Positive left knee pain.  Skin: Negative for rash. Neurological: Negative for headaches, focal weakness or numbness.  10-point ROS otherwise negative.  ____________________________________________   PHYSICAL EXAM:  VITAL SIGNS: ED Triage Vitals  Enc Vitals Group     BP 01/16/18 1228 (!) 163/100     Pulse Rate 01/16/18 1228 85     Resp 01/16/18 1228 14     Temp 01/16/18 1228 98.4 F (36.9 C)     Temp Source 01/16/18 1228 Oral     SpO2 01/16/18 1228 100 %     Weight 01/16/18 1229 285 lb  (129.3 kg)     Height 01/16/18 1229 5\' 9"  (1.753 m)     Pain Score 01/16/18 1228 8    Constitutional: Alert and oriented. Well appearing and in no acute distress. Eyes: Conjunctivae are normal.  Head: Atraumatic. \Nose: No congestion/rhinnorhea. Mouth/Throat: Mucous membranes are moist.  Neck: No stridor.   Cardiovascular: Normal rate, regular rhythm.    Respiratory: Normal respiratory effort.  Gastrointestinal: No distention.  Musculoskeletal: Mild left knee swelling without erythema. Limited ROM 2/2 pain. No deformity. No ankle tenderness or swelling. Normal ROM of the left hip.  Neurologic:  Normal speech and language. No gross focal neurologic deficits are appreciated.  Skin:  Skin is warm, dry and intact. No rash noted.  ____________________________________________  RADIOLOGY  Dg Knee Complete 4 Views Left  Result Date: 01/16/2018 CLINICAL DATA:  Left knee pain and swelling after bowling. EXAM: LEFT KNEE - COMPLETE 4+ VIEW COMPARISON:  None. FINDINGS: Moderate medial joint space narrowing and associated spur formation. Mild lateral spur formation. Mild to moderate posterior patellar spur formation. Small tibial spine spurs. These simulate a cortical irregularity on 1 of the oblique views, with no definite fracture seen. No effusion. IMPRESSION: Degenerative changes, as described above. Electronically Signed   By: Beckie Salts M.D.   On: 01/16/2018 13:12    ____________________________________________   PROCEDURES  Procedure(s) performed:   Procedures  None ____________________________________________  INITIAL IMPRESSION / ASSESSMENT AND PLAN / ED COURSE  Pertinent labs & imaging results that were available during my care of the patient were reviewed by me and considered in my medical decision making (see chart for details).  Patient with left knee pain. No clinical signs to suspect septic arthritis. Plain films with degenerative changes. Provided ACE wrap, Motrin,  and Crutches for WBAT. Referring to sports med.   At this time, I do not feel there is any life-threatening condition present. I have reviewed and discussed all results (EKG, imaging, lab, urine as appropriate), exam findings with patient. I have reviewed nursing notes and appropriate previous records.  I feel the patient is safe to be discharged home without further emergent workup. Discussed usual and customary return precautions. Patient and family (if present) verbalize understanding and are comfortable with this plan.  Patient will follow-up with their primary care provider. If they do not have a primary care provider, information for follow-up has been provided to them. All questions have been answered.   ____________________________________________  FINAL CLINICAL IMPRESSION(S) / ED DIAGNOSES  Final diagnoses:  Acute pain of left knee    NEW OUTPATIENT MEDICATIONS STARTED DURING THIS VISIT:  Discharge Medication List as of 01/16/2018  1:19 PM    START taking these medications   Details  ibuprofen (ADVIL,MOTRIN) 800 MG tablet Take 1 tablet (800 mg total) by mouth every 8 (eight) hours as needed., Starting Sun 01/16/2018, Print        Note:  This document was prepared using Dragon voice recognition software and may include unintentional dictation errors.  Alona Bene, MD Emergency Medicine    Long, Arlyss Repress, MD 01/17/18 [phone]

## 2018-01-18 ENCOUNTER — Ambulatory Visit (INDEPENDENT_AMBULATORY_CARE_PROVIDER_SITE_OTHER): Payer: Self-pay | Admitting: Family Medicine

## 2018-01-18 ENCOUNTER — Encounter: Payer: Self-pay | Admitting: Family Medicine

## 2018-01-18 VITALS — BP 174/117 | HR 74 | Ht 69.0 in | Wt 275.0 lb

## 2018-01-18 DIAGNOSIS — S8992XA Unspecified injury of left lower leg, initial encounter: Secondary | ICD-10-CM

## 2018-01-18 NOTE — Patient Instructions (Signed)
Your pain is due to arthritis vs less likely a degenerative meniscus tear; these are treated similarly. These are the different medications you can take for this: Aleve 2 tabs twice a day with food - take for 7-10 days then as needed. Tylenol 500mg  1-2 tabs three times a day for pain. Capsaicin, aspercreme, or biofreeze topically up to four times a day may also help with pain. Some supplements that may help for arthritis: Boswellia extract, curcumin, pycnogenol Cortisone injections are an option - if you're not improving over the next 1-2 weeks call me and we can go ahead with this as the next step. It's important that you continue to stay active. Straight leg raises, knee extensions, hamstring curls 3 sets of 10 once a day (add ankle weight if these become too easy). Consider physical therapy to strengthen muscles around the joint that hurts to take pressure off of the joint itself. Shoe inserts with good arch support may be helpful. Ice 15 minutes at a time 3-4 times a day as needed to help with pain and swelling. Elevate above the level of your heart, compression wrap or sleeve to help with swelling when up and walking around. Avoid squats, lunges, leg press. Follow up with me in 1 month if you're doing well but call me as noted above if you're not getting better and want to go ahead with injection.

## 2018-01-18 NOTE — Progress Notes (Signed)
PCP: Georgina Quint, MD  Subjective:   HPI: Patient is a 37 y.o. male here for left knee pain.  Patient reports 5/10 left anterior sharp knee pain since 01/09/2018.  Patient states he was bowling the night before but denies any specific injury.  He woke up with knee pain and swelling the following morning.  He was evaluated emergency department on 01/16/2018 and x-rays were performed.  Overall he has waxing and waning swelling which improves with rest.  He has been icing his knee regularly.  He has not been taking medications.  His pain is worse with stairs or knee flexion and walking.  He reports a history of meniscal surgery in 1997 or 98.  He denies any erythema or bruising.  No increased warmth.  No recent fevers or chills.  No history of gout.  No associated skin changes.  Past Medical History:  Diagnosis Date  . Hypertension     Current Outpatient Medications on File Prior to Visit  Medication Sig Dispense Refill  . amLODipine (NORVASC) 10 MG tablet Take 1 tablet (10 mg total) by mouth daily. (patient will use up his 5 mg tabs at first) (Patient not taking: Reported on 06/30/2017) 30 tablet 11  . amLODipine (NORVASC) 10 MG tablet Take 1 tablet (10 mg total) by mouth daily. 90 tablet 3  . ibuprofen (ADVIL,MOTRIN) 800 MG tablet Take 1 tablet (800 mg total) by mouth every 8 (eight) hours as needed. 21 tablet 0   No current facility-administered medications on file prior to visit.     Past Surgical History:  Procedure Laterality Date  . KNEE SURGERY      Allergies  Allergen Reactions  . Other Other (See Comments)    Family history of malignant hyperthermia under general anesthesia; pt himself has not undergone general anesthesia    Social History   Socioeconomic History  . Marital status: Single    Spouse name: Not on file  . Number of children: Not on file  . Years of education: Not on file  . Highest education level: Not on file  Occupational History  . Not on  file  Social Needs  . Financial resource strain: Not on file  . Food insecurity:    Worry: Not on file    Inability: Not on file  . Transportation needs:    Medical: Not on file    Non-medical: Not on file  Tobacco Use  . Smoking status: Never Smoker  . Smokeless tobacco: Never Used  Substance and Sexual Activity  . Alcohol use: Yes    Alcohol/week: 0.0 standard drinks    Comment: Weekends  . Drug use: No  . Sexual activity: Not on file  Lifestyle  . Physical activity:    Days per week: Not on file    Minutes per session: Not on file  . Stress: Not on file  Relationships  . Social connections:    Talks on phone: Not on file    Gets together: Not on file    Attends religious service: Not on file    Active member of club or organization: Not on file    Attends meetings of clubs or organizations: Not on file    Relationship status: Not on file  . Intimate partner violence:    Fear of current or ex partner: Not on file    Emotionally abused: Not on file    Physically abused: Not on file    Forced sexual activity: Not on file  Other Topics Concern  . Not on file  Social History Narrative  . Not on file    History reviewed. No pertinent family history.  BP (!) 174/117   Pulse 74   Ht 5\' 9"  (1.753 m)   Wt 275 lb (124.7 kg)   BMI 40.61 kg/m   Review of Systems: See HPI above.     Objective:  Physical Exam:  Gen: awake, alert, NAD, comfortable in exam room Pulm: breathing unlabored  Left knee: - Inspection: no gross deformity.  Mild effusion.  No erythema or bruising. Skin intact - Palpation: Tenderness over the medial and lateral joint line.  Tenderness over the medial and lateral patellofemoral joint.  Patient is most tender over the medial joint line. - ROM: Full range of motion extension.  Limited flexion due to pain. - Strength: 5/5 strength - Neuro/vasc: NV intact - Special Tests: - LIGAMENTS: negative anterior and posterior drawer, negative Lachman's,  no MCL or LCL laxity  -- MENISCUS: Pain with McMurray's, pain with Thessaly  -- PF JOINT: nml patellar mobility without apprehension  Right knee: No deformity or effusion No tenderness.   Full range of motion with 5/5 strength. N/V intact No collateral or cruciate laxity  Hips: No pain with passive IR/ER  Assessment & Plan:  1.  Left knee pain - secondary to arthritis flare versus degenerative meniscal tear.  Given the patient has diffuse pain, suspect arthritis.  X-rays from the emergency department dated 01/16/2018 were independently reviewed.  Of note these were not weightbearing films.  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  No acute bony abnormality. - Recommend Aleve for ibuprofen or Tylenol for pain - Discussed topical treatments - Discussed steroid injection which the patient declined at this time but will consider if no improvement in the next few weeks. - Asymmetric quadricep strengthening exercises - Recommended compression knee sleeve - Follow-up in about 4 weeks

## 2018-02-14 ENCOUNTER — Ambulatory Visit: Payer: Medicaid Other | Admitting: Family Medicine

## 2018-02-15 ENCOUNTER — Ambulatory Visit: Payer: Self-pay | Admitting: Family Medicine

## 2018-05-09 ENCOUNTER — Encounter: Payer: Self-pay | Admitting: Emergency Medicine

## 2018-06-14 ENCOUNTER — Telehealth: Payer: Self-pay | Admitting: *Deleted

## 2018-06-14 ENCOUNTER — Other Ambulatory Visit: Payer: Self-pay

## 2018-06-14 ENCOUNTER — Telehealth (INDEPENDENT_AMBULATORY_CARE_PROVIDER_SITE_OTHER): Payer: Medicaid Other | Admitting: Emergency Medicine

## 2018-06-14 DIAGNOSIS — I10 Essential (primary) hypertension: Secondary | ICD-10-CM

## 2018-06-14 NOTE — Telephone Encounter (Signed)
Spoke to patient after Virtual office visit to schedule nurse visit for labs. He will call back after checking work schedule.

## 2018-06-14 NOTE — Progress Notes (Signed)
Telemedicine Encounter- SOAP NOTE Established Patient  This telephone encounter was conducted with the patient's (or proxy's) verbal consent via audio telecommunications: yes/no: Yes Patient was instructed to have this encounter in a suitably private space; and to only have persons present to whom they give permission to participate. In addition, patient identity was confirmed by use of name plus two identifiers (DOB and address).  I discussed the limitations, risks, security and privacy concerns of performing an evaluation and management service by telephone and the availability of in person appointments. I also discussed with the patient that there may be a patient responsible charge related to this service. The patient expressed understanding and agreed to proceed.  I spent a total of TIME; 0 MIN TO 60 MIN: 15 minutes talking with the patient or their proxy.  No chief complaint on file. Hypertension follow-up  Subjective   Antonio Rodgers is a 38 y.o. male established patient. Telephone visit today for follow-up of hypertension.  Takes amlodipine 10 mg daily.  Has no complaints or medical concerns today.  HPI   Patient Active Problem List   Diagnosis Date Noted  . Essential hypertension 11/18/2014    Past Medical History:  Diagnosis Date  . Hypertension     Current Outpatient Medications  Medication Sig Dispense Refill  . amLODipine (NORVASC) 10 MG tablet Take 1 tablet (10 mg total) by mouth daily. 90 tablet 3  . amLODipine (NORVASC) 10 MG tablet Take 1 tablet (10 mg total) by mouth daily. (patient will use up his 5 mg tabs at first) (Patient not taking: Reported on 06/30/2017) 30 tablet 11  . ibuprofen (ADVIL,MOTRIN) 800 MG tablet Take 1 tablet (800 mg total) by mouth every 8 (eight) hours as needed. (Patient not taking: Reported on 06/14/2018) 21 tablet 0   No current facility-administered medications for this visit.     Allergies  Allergen Reactions  . Other Other (See  Comments)    Family history of malignant hyperthermia under general anesthesia; pt himself has not undergone general anesthesia    Social History   Socioeconomic History  . Marital status: Single    Spouse name: Not on file  . Number of children: Not on file  . Years of education: Not on file  . Highest education level: Not on file  Occupational History  . Not on file  Social Needs  . Financial resource strain: Not on file  . Food insecurity:    Worry: Not on file    Inability: Not on file  . Transportation needs:    Medical: Not on file    Non-medical: Not on file  Tobacco Use  . Smoking status: Never Smoker  . Smokeless tobacco: Never Used  Substance and Sexual Activity  . Alcohol use: Yes    Alcohol/week: 0.0 standard drinks    Comment: Weekends  . Drug use: No  . Sexual activity: Not on file  Lifestyle  . Physical activity:    Days per week: Not on file    Minutes per session: Not on file  . Stress: Not on file  Relationships  . Social connections:    Talks on phone: Not on file    Gets together: Not on file    Attends religious service: Not on file    Active member of club or organization: Not on file    Attends meetings of clubs or organizations: Not on file    Relationship status: Not on file  . Intimate partner violence:  Fear of current or ex partner: Not on file    Emotionally abused: Not on file    Physically abused: Not on file    Forced sexual activity: Not on file  Other Topics Concern  . Not on file  Social History Narrative  . Not on file    Review of Systems  Constitutional: Negative.  Negative for chills and fever.  HENT: Negative for congestion and sore throat.   Eyes: Negative for discharge and redness.  Respiratory: Negative for cough and shortness of breath.   Cardiovascular: Negative for chest pain and palpitations.  Gastrointestinal: Negative for abdominal pain, nausea and vomiting.  Musculoskeletal: Positive for joint pain (left  knee sprain).  Neurological: Negative for dizziness and headaches.  All other systems reviewed and are negative.   Objective   Vitals as reported by the patient:none There were no vitals filed for this visit.  Diagnoses and all orders for this visit:  Essential hypertension -     Comprehensive metabolic panel; Future -     Lipid panel; Future -     CBC with Differential/Platelet; Future    No medical concerns identified at this time.  I discussed the assessment and treatment plan with the patient. The patient was provided an opportunity to ask questions and all were answered. The patient agreed with the plan and demonstrated an understanding of the instructions.   The patient was advised to call back or seek an in-person evaluation if the symptoms worsen or if the condition fails to improve as anticipated.  I provided 15 minutes of non-face-to-face time during this encounter.  Georgina Quint, MD  Primary Care at [facility]

## 2018-07-13 ENCOUNTER — Encounter: Payer: Self-pay | Admitting: Emergency Medicine

## 2018-07-13 ENCOUNTER — Other Ambulatory Visit: Payer: Self-pay

## 2018-07-13 ENCOUNTER — Ambulatory Visit
Admission: EM | Admit: 2018-07-13 | Discharge: 2018-07-13 | Disposition: A | Discharge status: Home / Self Care | Payer: BLUE CROSS/BLUE SHIELD | Attending: Emergency Medicine | Admitting: Emergency Medicine

## 2018-07-13 DIAGNOSIS — S61411A Laceration without foreign body of right hand, initial encounter: Secondary | ICD-10-CM

## 2018-07-13 DIAGNOSIS — I10 Essential (primary) hypertension: Secondary | ICD-10-CM

## 2018-07-13 DIAGNOSIS — Z23 Encounter for immunization: Secondary | ICD-10-CM

## 2018-07-13 MED ORDER — TETANUS-DIPHTH-ACELL PERTUSSIS 5-2.5-18.5 LF-MCG/0.5 IM SUSP
0.5000 mL | Freq: Once | INTRAMUSCULAR | Status: AC
  Administered 2018-07-13: 0.5 mL via INTRAMUSCULAR

## 2018-07-13 MED ORDER — AMOXICILLIN-POT CLAVULANATE 875-125 MG PO TABS: 1.0000 | ORAL_TABLET | Freq: Two times a day (BID) | ORAL | 0 refills | Status: AC

## 2018-07-13 NOTE — ED Triage Notes (Signed)
Pt presents to [facility] for assessment of laceration to right hand from a broom.  Bleeding controlled.  Last tetanus unknown.

## 2018-07-13 NOTE — ED Notes (Signed)
Patient able to ambulate independently  

## 2018-07-13 NOTE — Discharge Instructions (Addendum)
Cleanse daily with soap and water.  5 days of prophylactic antibiotics.  Keep covered to keep clean.  Sutures to be removed in 10-14 days.  Return to be seen if develop increased pain, redness, or pus.

## 2018-07-13 NOTE — ED Provider Notes (Signed)
[facility]    CSN: [id number] Arrival date & time: 07/13/18  1751     History   Chief Complaint Chief Complaint  Patient presents with  . Laceration    HPI Antonio Rodgers is a 38 y.o. male.   Antonio Rodgers presents with complaints of laceration to palm of right hand. He was using a broom stick to scare away a lizard for his daughters when the end of it caught his hand and cut it. Bleeding controlled. Unknown last TDAP. No numbness or tingling. No weakness to hand. No previous injury to the hand. Did not cleanse wound prior to arrival. No hx of diabetes. Without contributing medical history.      ROS per HPI, negative if not otherwise mentioned.      Past Medical History:  Diagnosis Date  . Hypertension     Patient Active Problem List   Diagnosis Date Noted  . Essential hypertension 11/18/2014    Past Surgical History:  Procedure Laterality Date  . KNEE SURGERY         Home Medications    Prior to Admission medications   Medication Sig Start Date End Date Taking? Authorizing Provider  amLODipine (NORVASC) 10 MG tablet Take 1 tablet (10 mg total) by mouth daily. (patient will use up his 5 mg tabs at first) Patient not taking: Reported on 06/30/2017 04/28/16 06/30/17  Georgina Quint, MD  amLODipine (NORVASC) 10 MG tablet Take 1 tablet (10 mg total) by mouth daily. 06/30/17   Georgina Quint, MD  amoxicillin-clavulanate (AUGMENTIN) 875-125 MG tablet Take 1 tablet by mouth every 12 (twelve) hours for 5 days. 07/13/18 07/18/18  Georgetta Haber, NP  ibuprofen (ADVIL,MOTRIN) 800 MG tablet Take 1 tablet (800 mg total) by mouth every 8 (eight) hours as needed. Patient not taking: Reported on 06/14/2018 01/16/18   Long, Arlyss Repress, MD    Family History History reviewed. No pertinent family history.  Social History Social History   Tobacco Use  . Smoking status: Never Smoker  . Smokeless tobacco: Never Used  Substance Use Topics  . Alcohol use:  Yes    Alcohol/week: 0.0 standard drinks    Comment: Weekends  . Drug use: No     Allergies   Other   Review of Systems Review of Systems   Physical Exam Triage Vital Signs ED Triage Vitals  Enc Vitals Group     BP 07/13/18 1803 (!) 166/98     Pulse Rate 07/13/18 1803 89     Resp 07/13/18 1803 18     Temp 07/13/18 1803 98.5 F (36.9 C)     Temp Source 07/13/18 1803 Oral     SpO2 07/13/18 1803 97 %     Weight --      Height --      Head Circumference --      Peak Flow --      Pain Score 07/13/18 1804 2     Pain Loc --      Pain Edu? --      Excl. in GC? --    No data found.  Updated Vital Signs BP (!) 166/98 (BP Location: Left Arm)   Pulse 89   Temp 98.5 F (36.9 C) (Oral)   Resp 18   SpO2 97%    Physical Exam Constitutional:      Appearance: He is well-developed.  Cardiovascular:     Rate and Rhythm: Normal rate and regular rhythm.  Pulmonary:  Effort: Pulmonary effort is normal.     Breath sounds: Normal breath sounds.  Musculoskeletal:       Hands:     Comments: Approximately 1.5 cm in total half moon avulsion, approximately 4mm in depth; full ROM of hand and fingers without difficulty; no foreign bodies visualized; no active bleeding   Skin:    General: Skin is warm and dry.  Neurological:     Mental Status: He is alert and oriented to person, place, and time.      UC Treatments / Results  Labs (all labs ordered are listed, but only abnormal results are displayed) Labs Reviewed - No data to display  EKG None  Radiology No results found.  Procedures Laceration Repair Date/Time: 07/13/2018 7:05 PM Performed by: Georgetta HaberBurky, Truc Winfree B, NP Authorized by: Georgetta HaberBurky, Amiera Herzberg B, NP   Consent:    Consent obtained:  Verbal   Consent given by:  Patient   Risks discussed:  Poor cosmetic result, vascular damage, poor wound healing, pain, infection and need for additional repair   Alternatives discussed:  No treatment, observation and referral  Anesthesia (see MAR for exact dosages):    Anesthesia method:  Local infiltration   Local anesthetic:  Lidocaine 1% w/o epi Laceration details:    Location:  Hand   Hand location:  R palm   Length (cm):  1.5   Depth (mm):  4 Repair type:    Repair type:  Simple Pre-procedure details:    Preparation:  Patient was prepped and draped in usual sterile fashion Exploration:    Wound exploration: wound explored through full range of motion     Wound extent: no foreign bodies/material noted, no muscle damage noted, no tendon damage noted, no underlying fracture noted and no vascular damage noted     Contaminated: no   Treatment:    Area cleansed with:  Soap and water   Amount of cleaning:  Extensive Skin repair:    Repair method:  Sutures   Suture size:  5-0   Suture material:  Nylon   Suture technique:  Simple interrupted   Number of sutures:  4 Approximation:    Approximation:  Loose Post-procedure details:    Dressing:  Non-adherent dressing   Patient tolerance of procedure:  Tolerated well, no immediate complications Comments:     Loose approximation of avulsion to palm of hand with 4 closure sutures    (including critical care time)  Medications Ordered in UC Medications  Tdap (BOOSTRIX) injection 0.5 mL (0.5 mLs Intramuscular Given 07/13/18 1818)    Initial Impression / Assessment and Plan / UC Course  I have reviewed the triage vital signs and the nursing notes.  Pertinent labs & imaging results that were available during my care of the patient were reviewed by me and considered in my medical decision making (see chart for details).     4 loosely approximated simple sutures to palm of hand to keep avulsion intact. Wound care discussed. tdap updated. Antibiotics provided due to location of hand. Return precautions provided. Return in 10-14 days for removal. Patient verbalized understanding and agreeable to plan.   Final Clinical Impressions(s) / UC Diagnoses   Final  diagnoses:  Laceration of right hand without foreign body, initial encounter     Discharge Instructions     Cleanse daily with soap and water.  5 days of prophylactic antibiotics.  Keep covered to keep clean.  Sutures to be removed in 10-14 days.  Return to be seen if  develop increased pain, redness, or pus.    ED Prescriptions    Medication Sig Dispense Auth. Provider   amoxicillin-clavulanate (AUGMENTIN) 875-125 MG tablet Take 1 tablet by mouth every 12 (twelve) hours for 5 days. 10 tablet Georgetta Haber, NP     Controlled Substance Prescriptions [place] Controlled Substance Registry consulted? Not Applicable   Georgetta Haber, NP 07/13/18 1907

## 2018-07-18 ENCOUNTER — Encounter: Payer: Self-pay | Admitting: Emergency Medicine

## 2018-07-18 ENCOUNTER — Ambulatory Visit
Admission: EM | Admit: 2018-07-18 | Discharge: 2018-07-18 | Disposition: A | Discharge status: Home / Self Care | Payer: BLUE CROSS/BLUE SHIELD

## 2018-07-18 ENCOUNTER — Other Ambulatory Visit: Payer: Self-pay

## 2018-07-18 DIAGNOSIS — I10 Essential (primary) hypertension: Secondary | ICD-10-CM | POA: Diagnosis not present

## 2018-07-18 DIAGNOSIS — Z5189 Encounter for other specified aftercare: Secondary | ICD-10-CM

## 2018-07-18 NOTE — ED Notes (Signed)
Patient able to ambulate independently  

## 2018-07-18 NOTE — ED Triage Notes (Signed)
Pt presents to [facility] for re-assessment of laceration and stitches to right hand, previously sewn up.  Patient is concerned a stitch has been pulled out.

## 2018-07-18 NOTE — Discharge Instructions (Addendum)
Wound appears to be healing Come back in about 5 or 6 days for suture removal Monitor for  signs of infection

## 2018-07-19 NOTE — ED Provider Notes (Signed)
[facility]    CSN: [id number] Arrival date & time: 07/18/18  1733     History   Chief Complaint Chief Complaint  Patient presents with  . Wound Check    HPI Antonio Rodgers is a 38 y.o. male.   Patient is a 38 year old male who presents today for wound check.  He was seen on 4/22 and had a laceration sewn to the palm of the right hand.  He is here to ensure it is healing properly.  He has had some mild drainage from a small open area in the wound.  No erythema or swelling.  No pain or fevers. He has full range of motion of the hand.  He has been keeping covered and placing bacitracin ointment.   ROS per HPI      Past Medical History:  Diagnosis Date  . Hypertension     Patient Active Problem List   Diagnosis Date Noted  . Essential hypertension 11/18/2014    Past Surgical History:  Procedure Laterality Date  . KNEE SURGERY         Home Medications    Prior to Admission medications   Medication Sig Start Date End Date Taking? Authorizing Provider  amLODipine (NORVASC) 10 MG tablet Take 1 tablet (10 mg total) by mouth daily. (patient will use up his 5 mg tabs at first) Patient not taking: Reported on 06/30/2017 04/28/16 06/30/17  Georgina Quint, MD  amLODipine (NORVASC) 10 MG tablet Take 1 tablet (10 mg total) by mouth daily. 06/30/17   Georgina Quint, MD  ibuprofen (ADVIL,MOTRIN) 800 MG tablet Take 1 tablet (800 mg total) by mouth every 8 (eight) hours as needed. Patient not taking: Reported on 06/14/2018 01/16/18   Long, Arlyss Repress, MD    Family History History reviewed. No pertinent family history.  Social History Social History   Tobacco Use  . Smoking status: Never Smoker  . Smokeless tobacco: Never Used  Substance Use Topics  . Alcohol use: Yes    Alcohol/week: 0.0 standard drinks    Comment: Weekends  . Drug use: No     Allergies   Other   Review of Systems Review of Systems   Physical Exam Triage Vital Signs ED  Triage Vitals  Enc Vitals Group     BP 07/18/18 1742 (!) 169/122     Pulse Rate 07/18/18 1742 73     Resp 07/18/18 1742 18     Temp 07/18/18 1740 98.1 F (36.7 C)     Temp Source 07/18/18 1740 Oral     SpO2 07/18/18 1742 97 %     Weight --      Height --      Head Circumference --      Peak Flow --      Pain Score 07/18/18 1741 1     Pain Loc --      Pain Edu? --      Excl. in GC? --    No data found.  Updated Vital Signs BP (!) 169/122 (BP Location: Left Arm)   Pulse 73   Temp 98.1 F (36.7 C) (Oral)   Resp 18   SpO2 97%   Visual Acuity Right Eye Distance:   Left Eye Distance:   Bilateral Distance:    Right Eye Near:   Left Eye Near:    Bilateral Near:     Physical Exam Vitals signs and nursing note reviewed.  Constitutional:      Appearance: Normal appearance.  HENT:     Head: Normocephalic and atraumatic.     Nose: Nose normal.  Eyes:     Conjunctiva/sclera: Conjunctivae normal.  Neck:     Musculoskeletal: Normal range of motion.  Pulmonary:     Effort: Pulmonary effort is normal.  Musculoskeletal: Normal range of motion.  Skin:    General: Skin is warm and dry.     Comments: See picture for detail.  Wound appears to be well-healing.  Neurological:     Mental Status: He is alert.  Psychiatric:        Mood and Affect: Mood normal.        UC Treatments / Results  Labs (all labs ordered are listed, but only abnormal results are displayed) Labs Reviewed - No data to display  EKG None  Radiology No results found.  Procedures Procedures (including critical care time)  Medications Ordered in UC Medications - No data to display  Initial Impression / Assessment and Plan / UC Course  I have reviewed the triage vital signs and the nursing notes.  Pertinent labs & imaging results that were available during my care of the patient were reviewed by me and considered in my medical decision making (see chart for details).     Wound appears to  be well-healing Patient was concerned about open area No signs of infection  Instructed to follow up in 5 to 6 days for suture removal.   Final Clinical Impressions(s) / UC Diagnoses   Final diagnoses:  Visit for wound check     Discharge Instructions     Wound appears to be healing Come back in about 5 or 6 days for suture removal Monitor for  signs of infection    ED Prescriptions    None     Controlled Substance Prescriptions [place] Controlled Substance Registry consulted? Not Applicable   Janace ArisBast, Orby Tangen A, NP 07/19/18 [id number]

## 2018-07-27 ENCOUNTER — Other Ambulatory Visit: Payer: Self-pay

## 2018-07-27 ENCOUNTER — Ambulatory Visit
Admission: EM | Admit: 2018-07-27 | Discharge: 2018-07-27 | Disposition: A | Discharge status: Home / Self Care | Payer: BLUE CROSS/BLUE SHIELD

## 2018-07-27 DIAGNOSIS — Z4802 Encounter for removal of sutures: Secondary | ICD-10-CM

## 2018-07-27 NOTE — ED Triage Notes (Signed)
Pt here for suture removal from palm of rt hand. I removed 4sutures, no drainage or redness noted

## 2018-07-30 ENCOUNTER — Other Ambulatory Visit: Payer: Self-pay | Admitting: Emergency Medicine

## 2018-08-03 ENCOUNTER — Other Ambulatory Visit: Payer: Self-pay | Admitting: Emergency Medicine

## 2018-08-03 NOTE — Telephone Encounter (Signed)
Can this patient receive a refill on this medication? 

## 2018-10-31 ENCOUNTER — Other Ambulatory Visit: Payer: Self-pay | Admitting: Emergency Medicine

## 2018-10-31 NOTE — Telephone Encounter (Signed)
Please advise 

## 2018-12-15 ENCOUNTER — Other Ambulatory Visit: Payer: Self-pay | Admitting: Emergency Medicine

## 2018-12-15 NOTE — Telephone Encounter (Signed)
Requested medication (s) are due for refill today: yes  Requested medication (s) are on the active medication list: yes  Last refill: 11/21/2018  Future visit scheduled: no  Notes to clinic:  Review for refill   Requested Prescriptions  Pending Prescriptions Disp Refills   amLODipine (NORVASC) 10 MG tablet [Pharmacy Med Name: AMLODIPINE BESYLATE 10 MG TAB] 30 tablet 0    Sig: TAKE 1 TABLET BY MOUTH EVERY DAY     Cardiovascular:  Calcium Channel Blockers Failed - 12/15/2018  8:34 AM      Failed - Last BP in normal range    BP Readings from Last 1 Encounters:  07/18/18 (!) 169/122         Failed - Valid encounter within last 6 months    Recent Outpatient Visits          6 months ago Essential hypertension   [facility] [place], Ines Bloomer, MD   1 year ago Essential hypertension   [facility] [place], Ines Bloomer, MD   2 years ago Essential hypertension   [facility] [place], Ines Bloomer, MD   4 years ago Essential hypertension   [facility] at [facility], MD   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

## 2019-03-21 ENCOUNTER — Ambulatory Visit: Payer: BLUE CROSS/BLUE SHIELD | Attending: Internal Medicine

## 2019-05-15 ENCOUNTER — Ambulatory Visit: Payer: Medicaid Other | Admitting: Emergency Medicine

## 2019-05-15 ENCOUNTER — Encounter: Payer: Self-pay | Admitting: Emergency Medicine

## 2019-05-15 ENCOUNTER — Other Ambulatory Visit: Payer: Self-pay

## 2019-05-15 VITALS — BP 146/88 | HR 72 | Temp 97.5°F | Resp 16 | Ht 69.0 in | Wt 278.0 lb

## 2019-05-15 DIAGNOSIS — I10 Essential (primary) hypertension: Secondary | ICD-10-CM

## 2019-05-15 DIAGNOSIS — R42 Dizziness and giddiness: Secondary | ICD-10-CM

## 2019-05-15 MED ORDER — LOSARTAN POTASSIUM 50 MG PO TABS: 50.0000 mg | ORAL_TABLET | Freq: Every day | ORAL | 3 refills | Status: DC

## 2019-05-15 NOTE — Assessment & Plan Note (Addendum)
Uncontrolled blood pressure.  Continue amlodipine 10 mg daily and start losartan 50 mg daily.  Advised to continue monitoring blood pressure readings at home and monitor your symptoms as well.  Diet and nutrition discussed with patient as well as importance of physical activity.  Blood work done today while fasting. Follow-up in 6 months.

## 2019-05-15 NOTE — Progress Notes (Signed)
Antonio Rodgers 39 y.o.   Chief Complaint  Patient presents with   Hypertension    per pt lightheadness with dizziness x 5 days    HISTORY OF PRESENT ILLNESS: This is a 39 y.o. male with history of hypertension complaining of dizziness and lightheadedness that started 5 to 6 days ago. Denies associated symptoms.  Denies headache or vertigo, fever chills or flulike symptoms, visual problems, syncope, chest pain, difficulty breathing, nausea or vomiting, or any other significant symptoms. States blood pressure readings at home have been high with systolics in the 170's range and diastolics in the 100 range. Non-smoker.  No other chronic medical problems. No other complaints or medical concerns today.  HPI   Prior to Admission medications   Medication Sig Start Date End Date Taking? Authorizing Provider  amLODipine (NORVASC) 10 MG tablet Take 1 tablet (10 mg total) by mouth daily. 06/30/17  Yes Taim Wurm, Eilleen Kempf, MD  amLODipine (NORVASC) 10 MG tablet Take 1 tablet (10 mg total) by mouth daily. (patient will use up his 5 mg tabs at first) Patient not taking: Reported on 06/30/2017 04/28/16 06/30/17  Georgina Quint, MD  amLODipine (NORVASC) 10 MG tablet TAKE 1 TABLET BY MOUTH EVERY DAY 12/15/18   Georgina Quint, MD  ibuprofen (ADVIL,MOTRIN) 800 MG tablet Take 1 tablet (800 mg total) by mouth every 8 (eight) hours as needed. Patient not taking: Reported on 05/15/2019 01/16/18   Long, Arlyss Repress, MD    Allergies  Allergen Reactions   Other Other (See Comments)    Family history of malignant hyperthermia under general anesthesia; pt himself has not undergone general anesthesia    Patient Active Problem List   Diagnosis Date Noted   Essential hypertension 11/18/2014    Past Medical History:  Diagnosis Date   Hypertension     Past Surgical History:  Procedure Laterality Date   KNEE SURGERY      Social History   Socioeconomic History   Marital status: Single   Spouse name: Not on file   Number of children: Not on file   Years of education: Not on file   Highest education level: Not on file  Occupational History   Not on file  Tobacco Use   Smoking status: Never Smoker   Smokeless tobacco: Never Used  Substance and Sexual Activity   Alcohol use: Yes    Alcohol/week: 0.0 standard drinks    Comment: Weekends   Drug use: No   Sexual activity: Not on file  Other Topics Concern   Not on file  Social History Narrative   Not on file   Social Determinants of Health   Financial Resource Strain:    Difficulty of Paying Living Expenses: Not on file  Food Insecurity:    Worried About Running Out of Food in the Last Year: Not on file   Ran Out of Food in the Last Year: Not on file  Transportation Needs:    Lack of Transportation (Medical): Not on file   Lack of Transportation (Non-Medical): Not on file  Physical Activity:    Days of Exercise per Week: Not on file   Minutes of Exercise per Session: Not on file  Stress:    Feeling of Stress : Not on file  Social Connections:    Frequency of Communication with Friends and Family: Not on file   Frequency of Social Gatherings with Friends and Family: Not on file   Attends Religious Services: Not on file   Active Member  of Clubs or Organizations: Not on file   Attends Banker Meetings: Not on file   Marital Status: Not on file  Intimate Partner Violence:    Fear of Current or Ex-Partner: Not on file   Emotionally Abused: Not on file   Physically Abused: Not on file   Sexually Abused: Not on file    History reviewed. No pertinent family history.   ROS  Today's Vitals   05/15/19 1506  BP: (!) 168/98  Pulse: 72  Resp: 16  Temp: (!) 97.5 F (36.4 C)  TempSrc: Temporal  SpO2: 97%  Weight: 278 lb (126.1 kg)  Height: 5\' 9"  (1.753 m)   Body mass index is 41.05 kg/m.  Physical Exam Vitals reviewed.  Constitutional:      Appearance: Normal  appearance.  HENT:     Head: Normocephalic.  Eyes:     Extraocular Movements: Extraocular movements intact.     Conjunctiva/sclera: Conjunctivae normal.     Pupils: Pupils are equal, round, and reactive to light.  Cardiovascular:     Rate and Rhythm: Normal rate and regular rhythm.     Pulses: Normal pulses.     Heart sounds: Normal heart sounds.  Pulmonary:     Effort: Pulmonary effort is normal.     Breath sounds: Normal breath sounds.  Musculoskeletal:        General: Normal range of motion.     Cervical back: Normal range of motion and neck supple.  Skin:    General: Skin is warm and dry.     Capillary Refill: Capillary refill takes less than 2 seconds.  Neurological:     General: No focal deficit present.     Mental Status: He is alert and oriented to person, place, and time.  Psychiatric:        Mood and Affect: Mood normal.        Behavior: Behavior normal.    A total of 30 minutes was spent with the patient, greater than 50% of which was in counseling/coordination of care regarding hypertension and differential diagnosis of dizziness, review of most recent office visit notes, review of most recent blood work, review of medications and review of new medication, losartan, diet and nutrition, need to monitor blood pressure at home daily, prognosis, need for blood work today, and need for follow-up in 6 months.   ASSESSMENT & PLAN: Essential hypertension Uncontrolled blood pressure.  Continue amlodipine 10 mg daily and start losartan 50 mg daily.  Advised to continue monitoring blood pressure readings at home and monitor your symptoms as well.  Diet and nutrition discussed with patient as well as importance of physical activity.  Blood work done today while fasting. Follow-up in 6 months.   Janard was seen today for hypertension.  Diagnoses and all orders for this visit:  Dizziness  Essential hypertension -     CBC with Differential/Platelet -     Lipid panel -      Comprehensive metabolic panel -     losartan (COZAAR) 50 MG tablet; Take 1 tablet (50 mg total) by mouth daily.    Patient Instructions       If you have lab work done today you will be contacted with your lab results within the next 2 weeks.  If you have not heard from Reita Cliche then please contact us. The fastest way to get your results is to register for My Chart.   IF you received an x-ray today, you will receive an  invoice from [facility]. Please contact [facility] at [phone] with questions or concerns regarding your invoice.   IF you received labwork today, you will receive an invoice from [facility]. Please contact [facility] at [phone] with questions or concerns regarding your invoice.   Our billing staff will not be able to assist you with questions regarding bills from these companies.  You will be contacted with the lab results as soon as they are available. The fastest way to get your results is to activate your My Chart account. Instructions are located on the last page of this paperwork. If you have not heard from [place] regarding the results in 2 weeks, please contact this office.     Hypertension, Adult High blood pressure (hypertension) is when the force of blood pumping through the arteries is too strong. The arteries are the blood vessels that carry blood from the heart throughout the body. Hypertension forces the heart to work harder to pump blood and may cause arteries to become narrow or stiff. Untreated or uncontrolled hypertension can cause a heart attack, heart failure, a stroke, kidney disease, and other problems. A blood pressure reading consists of a higher number over a lower number. Ideally, your blood pressure should be below 120/80. The first ("top") number is called the systolic pressure. It is a measure of the pressure in your arteries as your heart beats. The second ("bottom") number is called the diastolic pressure. It is a measure of the  pressure in your arteries as the heart relaxes. What are the causes? The exact cause of this condition is not known. There are some conditions that result in or are related to high blood pressure. What increases the risk? Some risk factors for high blood pressure are under your control. The following factors may make you more likely to develop this condition:  Smoking.  Having type 2 diabetes mellitus, high cholesterol, or both.  Not getting enough exercise or physical activity.  Being overweight.  Having too much fat, sugar, calories, or salt (sodium) in your diet.  Drinking too much alcohol. Some risk factors for high blood pressure may be difficult or impossible to change. Some of these factors include:  Having chronic kidney disease.  Having a family history of high blood pressure.  Age. Risk increases with age.  Race. You may be at higher risk if you are African American.  Gender. Men are at higher risk than women before age 80. After age 28, women are at higher risk than men.  Having obstructive sleep apnea.  Stress. What are the signs or symptoms? High blood pressure may not cause symptoms. Very high blood pressure (hypertensive crisis) may cause:  Headache.  Anxiety.  Shortness of breath.  Nosebleed.  Nausea and vomiting.  Vision changes.  Severe chest pain.  Seizures. How is this diagnosed? This condition is diagnosed by measuring your blood pressure while you are seated, with your arm resting on a flat surface, your legs uncrossed, and your feet flat on the floor. The cuff of the blood pressure monitor will be placed directly against the skin of your upper arm at the level of your heart. It should be measured at least twice using the same arm. Certain conditions can cause a difference in blood pressure between your right and left arms. Certain factors can cause blood pressure readings to be lower or higher than normal for a short period of time:  When  your blood pressure is higher when you are in a health  care provider's office than when you are at home, this is called white coat hypertension. Most people with this condition do not need medicines.  When your blood pressure is higher at home than when you are in a health care provider's office, this is called masked hypertension. Most people with this condition may need medicines to control blood pressure. If you have a high blood pressure reading during one visit or you have normal blood pressure with other risk factors, you may be asked to:  Return on a different day to have your blood pressure checked again.  Monitor your blood pressure at home for 1 week or longer. If you are diagnosed with hypertension, you may have other blood or imaging tests to help your health care provider understand your overall risk for other conditions. How is this treated? This condition is treated by making healthy lifestyle changes, such as eating healthy foods, exercising more, and reducing your alcohol intake. Your health care provider may prescribe medicine if lifestyle changes are not enough to get your blood pressure under control, and if:  Your systolic blood pressure is above 130.  Your diastolic blood pressure is above 80. Your personal target blood pressure may vary depending on your medical conditions, your age, and other factors. Follow these instructions at home: Eating and drinking   Eat a diet that is high in fiber and potassium, and low in sodium, added sugar, and fat. An example eating plan is called the DASH (Dietary Approaches to Stop Hypertension) diet. To eat this way: ? Eat plenty of fresh fruits and vegetables. Try to fill one half of your plate at each meal with fruits and vegetables. ? Eat whole grains, such as whole-wheat pasta, brown rice, or whole-grain bread. Fill about one fourth of your plate with whole grains. ? Eat or drink low-fat dairy products, such as skim milk or low-fat  yogurt. ? Avoid fatty cuts of meat, processed or cured meats, and poultry with skin. Fill about one fourth of your plate with lean proteins, such as fish, chicken without skin, beans, eggs, or tofu. ? Avoid pre-made and processed foods. These tend to be higher in sodium, added sugar, and fat.  Reduce your daily sodium intake. Most people with hypertension should eat less than 1,500 mg of sodium a day.  Do not drink alcohol if: ? Your health care provider tells you not to drink. ? You are pregnant, may be pregnant, or are planning to become pregnant.  If you drink alcohol: ? Limit how much you use to:  0-1 drink a day for women.  0-2 drinks a day for men. ? Be aware of how much alcohol is in your drink. In the [place] one drink equals one 12 oz bottle of beer (355 mL), one 5 oz glass of wine (148 mL), or one 1 oz glass of hard liquor (44 mL). Lifestyle   Work with your health care provider to maintain a healthy body weight or to lose weight. Ask what an ideal weight is for you.  Get at least 30 minutes of exercise most days of the week. Activities may include walking, swimming, or biking.  Include exercise to strengthen your muscles (resistance exercise), such as Pilates or lifting weights, as part of your weekly exercise routine. Try to do these types of exercises for 30 minutes at least 3 days a week.  Do not use any products that contain nicotine or tobacco, such as cigarettes, e-cigarettes, and chewing tobacco. If you need  help quitting, ask your health care provider.  Monitor your blood pressure at home as told by your health care provider.  Keep all follow-up visits as told by your health care provider. This is important. Medicines  Take over-the-counter and prescription medicines only as told by your health care provider. Follow directions carefully. Blood pressure medicines must be taken as prescribed.  Do not skip doses of blood pressure medicine. Doing this puts you at risk  for problems and can make the medicine less effective.  Ask your health care provider about side effects or reactions to medicines that you should watch for. Contact a health care provider if you:  Think you are having a reaction to a medicine you are taking.  Have headaches that keep coming back (recurring).  Feel dizzy.  Have swelling in your ankles.  Have trouble with your vision. Get help right away if you:  Develop a severe headache or confusion.  Have unusual weakness or numbness.  Feel faint.  Have severe pain in your chest or abdomen.  Vomit repeatedly.  Have trouble breathing. Summary  Hypertension is when the force of blood pumping through your arteries is too strong. If this condition is not controlled, it may put you at risk for serious complications.  Your personal target blood pressure may vary depending on your medical conditions, your age, and other factors. For most people, a normal blood pressure is less than 120/80.  Hypertension is treated with lifestyle changes, medicines, or a combination of both. Lifestyle changes include losing weight, eating a healthy, low-sodium diet, exercising more, and limiting alcohol. This information is not intended to replace advice given to you by your health care provider. Make sure you discuss any questions you have with your health care provider. Document Revised: 11/17/2017 Document Reviewed: 11/17/2017 Elsevier Patient Education  2020 Elsevier Inc.      Edwina Barth, MD [facility]

## 2019-05-15 NOTE — Patient Instructions (Addendum)
   If you have lab work done today you will be contacted with your lab results within the next 2 weeks.  If you have not heard from us then please contact us. The fastest way to get your results is to register for My Chart.   IF you received an x-ray today, you will receive an invoice from [facility]. Please contact [facility] at [phone] with questions or concerns regarding your invoice.   IF you received labwork today, you will receive an invoice from [facility]. Please contact [facility] at [phone] with questions or concerns regarding your invoice.   Our billing staff will not be able to assist you with questions regarding bills from these companies.  You will be contacted with the lab results as soon as they are available. The fastest way to get your results is to activate your My Chart account. Instructions are located on the last page of this paperwork. If you have not heard from us regarding the results in 2 weeks, please contact this office.      Hypertension, Adult High blood pressure (hypertension) is when the force of blood pumping through the arteries is too strong. The arteries are the blood vessels that carry blood from the heart throughout the body. Hypertension forces the heart to work harder to pump blood and may cause arteries to become narrow or stiff. Untreated or uncontrolled hypertension can cause a heart attack, heart failure, a stroke, kidney disease, and other problems. A blood pressure reading consists of a higher number over a lower number. Ideally, your blood pressure should be below 120/80. The first ("top") number is called the systolic pressure. It is a measure of the pressure in your arteries as your heart beats. The second ("bottom") number is called the diastolic pressure. It is a measure of the pressure in your arteries as the heart relaxes. What are the causes? The exact cause of this condition is not known. There are some conditions  that result in or are related to high blood pressure. What increases the risk? Some risk factors for high blood pressure are under your control. The following factors may make you more likely to develop this condition:  Smoking.  Having type 2 diabetes mellitus, high cholesterol, or both.  Not getting enough exercise or physical activity.  Being overweight.  Having too much fat, sugar, calories, or salt (sodium) in your diet.  Drinking too much alcohol. Some risk factors for high blood pressure may be difficult or impossible to change. Some of these factors include:  Having chronic kidney disease.  Having a family history of high blood pressure.  Age. Risk increases with age.  Race. You may be at higher risk if you are African American.  Gender. Men are at higher risk than women before age 45. After age 65, women are at higher risk than men.  Having obstructive sleep apnea.  Stress. What are the signs or symptoms? High blood pressure may not cause symptoms. Very high blood pressure (hypertensive crisis) may cause:  Headache.  Anxiety.  Shortness of breath.  Nosebleed.  Nausea and vomiting.  Vision changes.  Severe chest pain.  Seizures. How is this diagnosed? This condition is diagnosed by measuring your blood pressure while you are seated, with your arm resting on a flat surface, your legs uncrossed, and your feet flat on the floor. The cuff of the blood pressure monitor will be placed directly against the skin of your upper arm at the level of your   heart. It should be measured at least twice using the same arm. Certain conditions can cause a difference in blood pressure between your right and left arms. Certain factors can cause blood pressure readings to be lower or higher than normal for a short period of time:  When your blood pressure is higher when you are in a health care provider's office than when you are at home, this is called white coat hypertension.  Most people with this condition do not need medicines.  When your blood pressure is higher at home than when you are in a health care provider's office, this is called masked hypertension. Most people with this condition may need medicines to control blood pressure. If you have a high blood pressure reading during one visit or you have normal blood pressure with other risk factors, you may be asked to:  Return on a different day to have your blood pressure checked again.  Monitor your blood pressure at home for 1 week or longer. If you are diagnosed with hypertension, you may have other blood or imaging tests to help your health care provider understand your overall risk for other conditions. How is this treated? This condition is treated by making healthy lifestyle changes, such as eating healthy foods, exercising more, and reducing your alcohol intake. Your health care provider may prescribe medicine if lifestyle changes are not enough to get your blood pressure under control, and if:  Your systolic blood pressure is above 130.  Your diastolic blood pressure is above 80. Your personal target blood pressure may vary depending on your medical conditions, your age, and other factors. Follow these instructions at home: Eating and drinking   Eat a diet that is high in fiber and potassium, and low in sodium, added sugar, and fat. An example eating plan is called the DASH (Dietary Approaches to Stop Hypertension) diet. To eat this way: ? Eat plenty of fresh fruits and vegetables. Try to fill one half of your plate at each meal with fruits and vegetables. ? Eat whole grains, such as whole-wheat pasta, brown rice, or whole-grain bread. Fill about one fourth of your plate with whole grains. ? Eat or drink low-fat dairy products, such as skim milk or low-fat yogurt. ? Avoid fatty cuts of meat, processed or cured meats, and poultry with skin. Fill about one fourth of your plate with lean proteins, such  as fish, chicken without skin, beans, eggs, or tofu. ? Avoid pre-made and processed foods. These tend to be higher in sodium, added sugar, and fat.  Reduce your daily sodium intake. Most people with hypertension should eat less than 1,500 mg of sodium a day.  Do not drink alcohol if: ? Your health care provider tells you not to drink. ? You are pregnant, may be pregnant, or are planning to become pregnant.  If you drink alcohol: ? Limit how much you use to:  0-1 drink a day for women.  0-2 drinks a day for men. ? Be aware of how much alcohol is in your drink. In the [place] one drink equals one 12 oz bottle of beer (355 mL), one 5 oz glass of wine (148 mL), or one 1 oz glass of hard liquor (44 mL). Lifestyle   Work with your health care provider to maintain a healthy body weight or to lose weight. Ask what an ideal weight is for you.  Get at least 30 minutes of exercise most days of the week. Activities may include walking, swimming,   or biking.  Include exercise to strengthen your muscles (resistance exercise), such as Pilates or lifting weights, as part of your weekly exercise routine. Try to do these types of exercises for 30 minutes at least 3 days a week.  Do not use any products that contain nicotine or tobacco, such as cigarettes, e-cigarettes, and chewing tobacco. If you need help quitting, ask your health care provider.  Monitor your blood pressure at home as told by your health care provider.  Keep all follow-up visits as told by your health care provider. This is important. Medicines  Take over-the-counter and prescription medicines only as told by your health care provider. Follow directions carefully. Blood pressure medicines must be taken as prescribed.  Do not skip doses of blood pressure medicine. Doing this puts you at risk for problems and can make the medicine less effective.  Ask your health care provider about side effects or reactions to medicines that you  should watch for. Contact a health care provider if you:  Think you are having a reaction to a medicine you are taking.  Have headaches that keep coming back (recurring).  Feel dizzy.  Have swelling in your ankles.  Have trouble with your vision. Get help right away if you:  Develop a severe headache or confusion.  Have unusual weakness or numbness.  Feel faint.  Have severe pain in your chest or abdomen.  Vomit repeatedly.  Have trouble breathing. Summary  Hypertension is when the force of blood pumping through your arteries is too strong. If this condition is not controlled, it may put you at risk for serious complications.  Your personal target blood pressure may vary depending on your medical conditions, your age, and other factors. For most people, a normal blood pressure is less than 120/80.  Hypertension is treated with lifestyle changes, medicines, or a combination of both. Lifestyle changes include losing weight, eating a healthy, low-sodium diet, exercising more, and limiting alcohol. This information is not intended to replace advice given to you by your health care provider. Make sure you discuss any questions you have with your health care provider. Document Revised: 11/17/2017 Document Reviewed: 11/17/2017 Elsevier Patient Education  2020 [employer] Inc.  

## 2019-05-16 ENCOUNTER — Encounter: Payer: Self-pay | Admitting: Emergency Medicine

## 2019-05-16 ENCOUNTER — Other Ambulatory Visit: Payer: Self-pay | Admitting: Emergency Medicine

## 2019-05-16 DIAGNOSIS — E785 Hyperlipidemia, unspecified: Secondary | ICD-10-CM

## 2019-05-16 LAB — COMPREHENSIVE METABOLIC PANEL
ALT: 33 IU/L (ref 0–44)
AST: 30 IU/L (ref 0–40)
Albumin/Globulin Ratio: 1.8 (ref 1.2–2.2)
Albumin: 4.9 g/dL (ref 4.0–5.0)
Alkaline Phosphatase: 79 IU/L (ref 39–117)
BUN/Creatinine Ratio: 8 — ABNORMAL LOW (ref 9–20)
BUN: 8 mg/dL (ref 6–20)
Bilirubin Total: 0.6 mg/dL (ref 0.0–1.2)
CO2: 24 mmol/L (ref 20–29)
Calcium: 9.4 mg/dL (ref 8.7–10.2)
Chloride: 100 mmol/L (ref 96–106)
Creatinine, Ser: 0.96 mg/dL (ref 0.76–1.27)
GFR calc Af Amer: 115 mL/min/{1.73_m2} (ref >59)
GFR calc non Af Amer: 99 mL/min/{1.73_m2} (ref >59)
Globulin, Total: 2.7 g/dL (ref 1.5–4.5)
Glucose: 89 mg/dL (ref 65–99)
Potassium: 4 mmol/L (ref 3.5–5.2)
Sodium: 140 mmol/L (ref 134–144)
Total Protein: 7.6 g/dL (ref 6.0–8.5)

## 2019-05-16 LAB — CBC WITH DIFFERENTIAL/PLATELET
Basophils Absolute: 0 10*3/uL (ref 0.0–0.2)
Basos: 0 %
EOS (ABSOLUTE): 0.1 10*3/uL (ref 0.0–0.4)
Eos: 1 %
Hematocrit: 47.6 % (ref 37.5–51.0)
Hemoglobin: 16.3 g/dL (ref 13.0–17.7)
Immature Grans (Abs): 0 10*3/uL (ref 0.0–0.1)
Immature Granulocytes: 0 %
Lymphocytes Absolute: 2.1 10*3/uL (ref 0.7–3.1)
Lymphs: 28 %
MCH: 28.6 pg (ref 26.6–33.0)
MCHC: 34.2 g/dL (ref 31.5–35.7)
MCV: 84 fL (ref 79–97)
Monocytes Absolute: 0.5 10*3/uL (ref 0.1–0.9)
Monocytes: 7 %
Neutrophils Absolute: 4.8 10*3/uL (ref 1.4–7.0)
Neutrophils: 64 %
Platelets: 327 10*3/uL (ref 150–450)
RBC: 5.7 x10E6/uL (ref 4.14–5.80)
RDW: 13.1 % (ref 11.6–15.4)
WBC: 7.6 10*3/uL (ref 3.4–10.8)

## 2019-05-16 LAB — LIPID PANEL
Chol/HDL Ratio: 6.9 ratio — ABNORMAL HIGH (ref 0.0–5.0)
Cholesterol, Total: 235 mg/dL — ABNORMAL HIGH (ref 100–199)
HDL: 34 mg/dL — ABNORMAL LOW (ref >39)
LDL Chol Calc (NIH): 170 mg/dL — ABNORMAL HIGH (ref 0–99)
Triglycerides: 166 mg/dL — ABNORMAL HIGH (ref 0–149)
VLDL Cholesterol Cal: 31 mg/dL (ref 5–40)

## 2019-05-16 MED ORDER — ROSUVASTATIN CALCIUM 20 MG PO TABS: 20.0000 mg | ORAL_TABLET | Freq: Every day | ORAL | 3 refills | Status: DC

## 2019-05-16 NOTE — Progress Notes (Signed)
Abnormal lipid profile.  Will start Crestor 20 mg daily.  Note sent through my chart.

## 2019-05-26 ENCOUNTER — Ambulatory Visit: Payer: Medicaid Other | Attending: Internal Medicine

## 2019-05-26 DIAGNOSIS — Z23 Encounter for immunization: Secondary | ICD-10-CM | POA: Insufficient documentation

## 2019-05-26 NOTE — Progress Notes (Signed)
   Covid-19 Vaccination Clinic  Name:  Antonio Rodgers    MRN: [id number] DOB: 25-Jul-1980  05/26/2019  [name] was observed post Covid-19 immunization for 15 minutes without incident. He was provided with Vaccine Information Sheet and instruction to access the V-Safe system.   [name] was instructed to call 911 with any severe reactions post vaccine: Marland Kitchen Difficulty breathing  . Swelling of face and throat  . A fast heartbeat  . A bad rash all over body  . Dizziness and weakness

## 2019-06-26 ENCOUNTER — Ambulatory Visit: Payer: Medicaid Other

## 2019-06-28 ENCOUNTER — Ambulatory Visit: Payer: Medicaid Other | Attending: Internal Medicine

## 2019-06-28 DIAGNOSIS — Z23 Encounter for immunization: Secondary | ICD-10-CM

## 2019-06-28 NOTE — Progress Notes (Signed)
   Covid-19 Vaccination Clinic  Name:  Antonio Rodgers    MRN: [id number] DOB: 1980/07/17  06/28/2019  [name] was observed post Covid-19 immunization for 15 minutes without incident. He was provided with Vaccine Information Sheet and instruction to access the V-Safe system.   [name] was instructed to call 911 with any severe reactions post vaccine: Marland Kitchen Difficulty breathing  . Swelling of face and throat  . A fast heartbeat  . A bad rash all over body  . Dizziness and weakness   Immunizations Administered    Name Date Dose VIS Date Route   Pfizer COVID-19 Vaccine 06/28/2019  2:23 PM 0.3 mL 03/03/2019 Intramuscular   Manufacturer: [employer], [employer]   Lot: [id number]   NDC: [address]-3

## 2019-08-30 ENCOUNTER — Other Ambulatory Visit: Payer: Self-pay | Admitting: Emergency Medicine

## 2019-09-05 ENCOUNTER — Telehealth: Payer: Self-pay | Admitting: Emergency Medicine

## 2019-09-05 NOTE — Telephone Encounter (Signed)
LVM for pt to let him know that his medication is at the pharmacy and awaiting for pick up and the cost is $25.99

## 2019-09-05 NOTE — Telephone Encounter (Signed)
Pt called regarding   amLODipine (NORVASC) 10 MG tablet [[id number]]   That was sent in on 08/30/19. Pt went to pick up medication from pharmacy and pharmacy stated they couldn't give it to pt and he would need to contact providers office. Let pt know that provider is out of office this week. Pt would like a call when this is resolved. [phone] Please advise.

## 2019-09-18 ENCOUNTER — Ambulatory Visit: Payer: 59 | Admitting: Emergency Medicine

## 2019-11-13 ENCOUNTER — Ambulatory Visit: Payer: 59 | Admitting: Emergency Medicine

## 2019-11-14 ENCOUNTER — Encounter: Payer: Self-pay | Admitting: Emergency Medicine

## 2019-11-28 ENCOUNTER — Other Ambulatory Visit: Payer: Self-pay | Admitting: Emergency Medicine

## 2019-11-28 NOTE — Telephone Encounter (Signed)
Requested medication (s) are due for refill today: no  Requested medication (s) are on the active medication list: yes   Last refill:  09/05/2019  Future visit scheduled:no  Notes to clinic:  overdue for 6 month follow up    Requested Prescriptions  Pending Prescriptions Disp Refills   amLODipine (NORVASC) 10 MG tablet [Pharmacy Med Name: AMLODIPINE BESYLATE 10 MG TAB] 90 tablet 0    Sig: TAKE 1 TABLET BY MOUTH EVERY DAY      Cardiovascular:  Calcium Channel Blockers Failed - 11/28/2019  1:32 AM      Failed - Last BP in normal range    BP Readings from Last 1 Encounters:  05/15/19 (!) 146/88          Failed - Valid encounter within last 6 months    Recent Outpatient Visits           6 months ago Dizziness   [facility] [place], Eilleen Kempf, MD   1 year ago Essential hypertension   [facility] at [place], Eilleen Kempf, MD   2 years ago Essential hypertension   [facility] [place], Eilleen Kempf, MD   3 years ago Essential hypertension   [facility] at [place], Eilleen Kempf, MD   5 years ago Essential hypertension   [facility] [place] [facility], MD

## 2020-01-26 ENCOUNTER — Telehealth (INDEPENDENT_AMBULATORY_CARE_PROVIDER_SITE_OTHER): Payer: 59 | Admitting: Family Medicine

## 2020-01-26 ENCOUNTER — Other Ambulatory Visit: Payer: Self-pay

## 2020-01-26 ENCOUNTER — Encounter: Payer: Self-pay | Admitting: Family Medicine

## 2020-01-26 VITALS — Ht 69.0 in | Wt 270.0 lb

## 2020-01-26 DIAGNOSIS — R0981 Nasal congestion: Secondary | ICD-10-CM | POA: Diagnosis not present

## 2020-01-26 DIAGNOSIS — J309 Allergic rhinitis, unspecified: Secondary | ICD-10-CM

## 2020-01-26 DIAGNOSIS — H1013 Acute atopic conjunctivitis, bilateral: Secondary | ICD-10-CM | POA: Diagnosis not present

## 2020-01-26 MED ORDER — FLUTICASONE PROPIONATE 50 MCG/ACT NA SUSP
2.0000 | Freq: Every day | NASAL | 6 refills | Status: DC
Start: 2020-01-26 — End: 2021-10-21

## 2020-01-26 MED ORDER — AZELASTINE HCL 0.05 % OP SOLN: 1.0000 [drp] | Freq: Two times a day (BID) | OPHTHALMIC | 3 refills | Status: DC

## 2020-01-26 NOTE — Patient Instructions (Signed)
° ° ° °  If you have lab work done today you will be contacted with your lab results within the next 2 weeks.  If you have not heard from us then please contact us. The fastest way to get your results is to register for My Chart. ° ° °IF you received an x-ray today, you will receive an invoice from [facility]. Please contact [facility] at [phone] with questions or concerns regarding your invoice.  ° °IF you received labwork today, you will receive an invoice from [facility]. Please contact [facility] at [phone] with questions or concerns regarding your invoice.  ° °Our billing staff will not be able to assist you with questions regarding bills from these companies. ° °You will be contacted with the lab results as soon as they are available. The fastest way to get your results is to activate your My Chart account. Instructions are located on the last page of this paperwork. If you have not heard from us regarding the results in 2 weeks, please contact this office. °  ° ° ° °

## 2020-01-26 NOTE — Progress Notes (Signed)
Virtual Visit via Video Note  I connected with Antonio Rodgers on 01/26/20 at 9:10 AM by a video enabled telemedicine application and verified that I am speaking with the correct person using two identifiers.  Patient location: home My location: office  Reverted to audio after connection issues.    I discussed the limitations, risks, security and privacy concerns of performing an evaluation and management service by telephone and the availability of in person appointments. I also discussed with the patient that there may be a patient responsible charge related to this service. The patient expressed understanding and agreed to proceed, consent obtained  Chief complaint:  Chief Complaint  Patient presents with  . Allergies    patient states he have stuffy nose, watery eyes for 2 1/2 months. Patient has hx of seasonal allergy    History of Present Illness: Antonio Rodgers is a 39 y.o. male  Started with sinus congestion, runny nose past few days, watery eyes for past few months. Vision ok.  No fever, change in taste/smell. No HA, body aches. No exposure to covid. No cough/dyspnea.  Had pfizer vaccine. No booster.  Has covid test planned later today, would like one here.  Usually seasonal - happens with weather change.  Takes otc allergy relief in past - has improved with 24 hour claritin, better during day, stuffy/pnd returns at night. Has tried nasal lavage BID yesterday  Patient Active Problem List   Diagnosis Date Noted  . Essential hypertension 11/18/2014   Past Medical History:  Diagnosis Date  . Hypertension    Past Surgical History:  Procedure Laterality Date  . KNEE SURGERY     Allergies  Allergen Reactions  . Other Other (See Comments)    Family history of malignant hyperthermia under general anesthesia; pt himself has not undergone general anesthesia   Prior to Admission medications   Medication Sig Start Date End Date Taking? Authorizing Provider  amLODipine (NORVASC)  10 MG tablet TAKE 1 TABLET BY MOUTH EVERY DAY 08/30/19  Yes Sagardia, Eilleen Kempf, MD  losartan (COZAAR) 50 MG tablet Take 1 tablet (50 mg total) by mouth daily. 05/15/19  Yes Sagardia, Eilleen Kempf, MD  rosuvastatin (CRESTOR) 20 MG tablet Take 1 tablet (20 mg total) by mouth daily. 05/16/19  Yes SagardiaEilleen Kempf, MD   Social History   Socioeconomic History  . Marital status: Single    Spouse name: Not on file  . Number of children: Not on file  . Years of education: Not on file  . Highest education level: Not on file  Occupational History  . Not on file  Tobacco Use  . Smoking status: Never Smoker  . Smokeless tobacco: Never Used  Vaping Use  . Vaping Use: Never used  Substance and Sexual Activity  . Alcohol use: Yes    Alcohol/week: 0.0 standard drinks    Comment: Weekends  . Drug use: No  . Sexual activity: Not on file  Other Topics Concern  . Not on file  Social History Narrative  . Not on file   Social Determinants of Health   Financial Resource Strain:   . Difficulty of Paying Living Expenses: Not on file  Food Insecurity:   . Worried About Programme researcher, broadcasting/film/video in the Last Year: Not on file  . Ran Out of Food in the Last Year: Not on file  Transportation Needs:   . Lack of Transportation (Medical): Not on file  . Lack of Transportation (Non-Medical): Not on file  Physical  Activity:   . Days of Exercise per Week: Not on file  . Minutes of Exercise per Session: Not on file  Stress:   . Feeling of Stress : Not on file  Social Connections:   . Frequency of Communication with Friends and Family: Not on file  . Frequency of Social Gatherings with Friends and Family: Not on file  . Attends Religious Services: Not on file  . Active Member of Clubs or Organizations: Not on file  . Attends Banker Meetings: Not on file  . Marital Status: Not on file  Intimate Partner Violence:   . Fear of Current or Ex-Partner: Not on file  . Emotionally Abused: Not on  file  . Physically Abused: Not on file  . Sexually Abused: Not on file    Observations/Objective: Vitals:   01/26/20 0754  Weight: 270 lb (122.5 kg)  Height: 5\' 9"  (1.753 m)   No distress, speaking full sentences, nontoxic appearance on initial video call.  All questions answered with family expressed.  Assessment and Plan: Allergic rhinitis, unspecified seasonality, unspecified trigger - Plan: fluticasone (FLONASE) 50 MCG/ACT nasal spray  Sinus congestion - Plan: Novel Coronavirus, NAA ([facility])  Allergic conjunctivitis of both eyes - Plan: azelastine (OPTIVAR) 0.05 % ophthalmic solution  Suspected allergic rhinitis/allergic conjunctivitis with recent increase congestion past few days.  Continue over-the-counter antihistamine.  Add Flonase, add Optivar for ophthalmic symptoms with RTC precautions if those do not help.  Correct use of Flonase discussed.  Check COVID-19 testing today although less likely cause.  He is vaccinated.  ER precautions given.  Follow Up Instructions:    I discussed the assessment and treatment plan with the patient. The patient was provided an opportunity to ask questions and all were answered. The patient agreed with the plan and demonstrated an understanding of the instructions.   The patient was advised to call back or seek an in-person evaluation if the symptoms worsen or if the condition fails to improve as anticipated.  I provided 11 minutes of non-face-to-face time during this encounter.   , MD

## 2020-01-27 LAB — NOVEL CORONAVIRUS, NAA: SARS-CoV-2, NAA: NOT DETECTED

## 2020-01-27 LAB — SARS-COV-2, NAA 2 DAY TAT

## 2020-02-09 ENCOUNTER — Other Ambulatory Visit: Payer: Self-pay | Admitting: Emergency Medicine

## 2020-05-05 ENCOUNTER — Other Ambulatory Visit: Payer: Self-pay | Admitting: Emergency Medicine

## 2020-05-05 NOTE — Telephone Encounter (Signed)
Courtesy refill - Pt needs office visit. Requested Prescriptions  Pending Prescriptions Disp Refills  . amLODipine (NORVASC) 10 MG tablet [Pharmacy Med Name: AMLODIPINE BESYLATE 10 MG TAB] 30 tablet 0    Sig: TAKE 1 TABLET BY MOUTH EVERY DAY     Cardiovascular:  Calcium Channel Blockers Failed - 05/05/2020 12:45 AM      Failed - Last BP in normal range    BP Readings from Last 1 Encounters:  05/15/19 (!) 146/88         Passed - Valid encounter within last 6 months    Recent Outpatient Visits          3 months ago Allergic rhinitis, unspecified seasonality, unspecified trigger   [facility] at [place], Asencion Partridge, MD   11 months ago Dizziness   [facility] at [facility], Eilleen Kempf, MD   1 year ago Essential hypertension   [facility] at [place], Eilleen Kempf, MD   2 years ago Essential hypertension   [facility] at [place], Eilleen Kempf, MD   4 years ago Essential hypertension   [facility] Care at [facility], [place], [place]

## 2020-08-20 ENCOUNTER — Other Ambulatory Visit: Payer: Self-pay | Admitting: Emergency Medicine

## 2020-08-20 DIAGNOSIS — E785 Hyperlipidemia, unspecified: Secondary | ICD-10-CM

## 2020-08-20 DIAGNOSIS — I10 Essential (primary) hypertension: Secondary | ICD-10-CM

## 2020-09-15 ENCOUNTER — Other Ambulatory Visit: Payer: Self-pay | Admitting: Emergency Medicine

## 2020-09-15 DIAGNOSIS — E785 Hyperlipidemia, unspecified: Secondary | ICD-10-CM

## 2020-09-15 DIAGNOSIS — I10 Essential (primary) hypertension: Secondary | ICD-10-CM

## 2020-10-01 ENCOUNTER — Other Ambulatory Visit: Payer: Self-pay | Admitting: Emergency Medicine

## 2020-10-01 DIAGNOSIS — I10 Essential (primary) hypertension: Secondary | ICD-10-CM

## 2020-10-01 DIAGNOSIS — E785 Hyperlipidemia, unspecified: Secondary | ICD-10-CM

## 2021-04-14 ENCOUNTER — Emergency Department (HOSPITAL_BASED_OUTPATIENT_CLINIC_OR_DEPARTMENT_OTHER): Payer: BLUE CROSS/BLUE SHIELD

## 2021-04-14 ENCOUNTER — Other Ambulatory Visit: Payer: Self-pay

## 2021-04-14 ENCOUNTER — Emergency Department (HOSPITAL_BASED_OUTPATIENT_CLINIC_OR_DEPARTMENT_OTHER)
Admission: EM | Admit: 2021-04-14 | Discharge: 2021-04-14 | Disposition: A | Discharge status: Home / Self Care | Payer: BLUE CROSS/BLUE SHIELD | Attending: Emergency Medicine | Admitting: Emergency Medicine

## 2021-04-14 ENCOUNTER — Encounter (HOSPITAL_BASED_OUTPATIENT_CLINIC_OR_DEPARTMENT_OTHER): Payer: Self-pay | Admitting: *Deleted

## 2021-04-14 DIAGNOSIS — Z79899 Other long term (current) drug therapy: Secondary | ICD-10-CM | POA: Insufficient documentation

## 2021-04-14 DIAGNOSIS — R519 Headache, unspecified: Secondary | ICD-10-CM | POA: Diagnosis present

## 2021-04-14 DIAGNOSIS — R000 Tachycardia, unspecified: Secondary | ICD-10-CM | POA: Diagnosis not present

## 2021-04-14 DIAGNOSIS — I10 Essential (primary) hypertension: Secondary | ICD-10-CM | POA: Insufficient documentation

## 2021-04-14 DIAGNOSIS — R112 Nausea with vomiting, unspecified: Secondary | ICD-10-CM | POA: Insufficient documentation

## 2021-04-14 LAB — CBC WITH DIFFERENTIAL/PLATELET
Abs Immature Granulocytes: 0.04 10*3/uL (ref 0.00–0.07)
Basophils Absolute: 0.1 10*3/uL (ref 0.0–0.1)
Basophils Relative: 1 %
Eosinophils Absolute: 0 10*3/uL (ref 0.0–0.5)
Eosinophils Relative: 0 %
HCT: 47.4 % (ref 39.0–52.0)
Hemoglobin: 16.7 g/dL (ref 13.0–17.0)
Immature Granulocytes: 0 %
Lymphocytes Relative: 17 %
Lymphs Abs: 1.7 10*3/uL (ref 0.7–4.0)
MCH: 28.5 pg (ref 26.0–34.0)
MCHC: 35.2 g/dL (ref 30.0–36.0)
MCV: 81 fL (ref 80.0–100.0)
Monocytes Absolute: 0.5 10*3/uL (ref 0.1–1.0)
Monocytes Relative: 6 %
Neutro Abs: 7.4 10*3/uL (ref 1.7–7.7)
Neutrophils Relative %: 76 %
Platelets: 326 10*3/uL (ref 150–400)
RBC: 5.85 MIL/uL — ABNORMAL HIGH (ref 4.22–5.81)
RDW: 12.2 % (ref 11.5–15.5)
WBC: 9.7 10*3/uL (ref 4.0–10.5)
nRBC: 0 % (ref 0.0–0.2)

## 2021-04-14 LAB — COMPREHENSIVE METABOLIC PANEL
ALT: 79 U/L — ABNORMAL HIGH (ref 0–44)
AST: 82 U/L — ABNORMAL HIGH (ref 15–41)
Albumin: 4.7 g/dL (ref 3.5–5.0)
Alkaline Phosphatase: 72 U/L (ref 38–126)
Anion gap: 13 (ref 5–15)
BUN: 8 mg/dL (ref 6–20)
CO2: 23 mmol/L (ref 22–32)
Calcium: 8.7 mg/dL — ABNORMAL LOW (ref 8.9–10.3)
Chloride: 96 mmol/L — ABNORMAL LOW (ref 98–111)
Creatinine, Ser: 0.83 mg/dL (ref 0.61–1.24)
GFR, Estimated: >60 mL/min (ref >60)
Glucose, Bld: 114 mg/dL — ABNORMAL HIGH (ref 70–99)
Potassium: 2.9 mmol/L — ABNORMAL LOW (ref 3.5–5.1)
Sodium: 132 mmol/L — ABNORMAL LOW (ref 135–145)
Total Bilirubin: 1.1 mg/dL (ref 0.3–1.2)
Total Protein: 8.5 g/dL — ABNORMAL HIGH (ref 6.5–8.1)

## 2021-04-14 MED ORDER — DIPHENHYDRAMINE HCL 25 MG PO CAPS
25.0000 mg | ORAL_CAPSULE | Freq: Once | ORAL | Status: AC
  Administered 2021-04-14: 25 mg via ORAL
  Filled 2021-04-14: qty 1

## 2021-04-14 MED ORDER — POTASSIUM CHLORIDE CRYS ER 20 MEQ PO TBCR
40.0000 meq | EXTENDED_RELEASE_TABLET | Freq: Once | ORAL | Status: AC
  Administered 2021-04-14: 40 meq via ORAL
  Filled 2021-04-14: qty 2

## 2021-04-14 MED ORDER — PROCHLORPERAZINE EDISYLATE 10 MG/2ML IJ SOLN
10.0000 mg | Freq: Once | INTRAMUSCULAR | Status: AC
  Administered 2021-04-14: 10 mg via INTRAVENOUS
  Filled 2021-04-14: qty 2

## 2021-04-14 MED ORDER — SODIUM CHLORIDE 0.9 % IV BOLUS
1000.0000 mL | Freq: Once | INTRAVENOUS | Status: AC
  Administered 2021-04-14: 1000 mL via INTRAVENOUS

## 2021-04-14 NOTE — Discharge Instructions (Signed)
As discussed, please follow-up with your primary care doctor for further blood pressure management.  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  Radiology report is below.  If you develop more severe headache, chest pain, uncontrollable nausea and vomiting, stroke symptoms please return for evaluation.  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 As this is not optimally characterized  on this examination, contrast enhanced MRI/MRA would be helpful for  further evaluation.

## 2021-04-14 NOTE — ED Provider Notes (Signed)
[facility] EMERGENCY DEPARTMENT Provider Note   CSN: [id number] Arrival date & time: 04/14/21  2046     History  Chief Complaint  Patient presents with   Hypertension    Antonio Rodgers is a 41 y.o. male.  Patient here with some nausea and vomiting, high blood pressure after drinking alcohol yesterday.  Feels dehydrated.  Denies any abdominal pain.  Has had a mild headache.  Has forced himself to vomit a couple times.  The history is provided by the patient.  Hypertension This is a chronic problem. The problem occurs constantly. The problem has not changed since onset.Associated symptoms include headaches. Pertinent negatives include no chest pain, no abdominal pain and no shortness of breath. Nothing aggravates the symptoms. Nothing relieves the symptoms. He has tried nothing for the symptoms. The treatment provided no relief.      Home Medications Prior to Admission medications   Medication Sig Start Date End Date Taking? Authorizing Provider  amLODipine (NORVASC) 10 MG tablet TAKE 1 TABLET BY MOUTH EVERY DAY 10/01/20  Yes Sagardia, Eilleen Kempf, MD  losartan (COZAAR) 50 MG tablet TAKE 1 TABLET BY MOUTH EVERY DAY 10/01/20  Yes Sagardia, Eilleen Kempf, MD  rosuvastatin (CRESTOR) 20 MG tablet TAKE 1 TABLET BY MOUTH EVERY DAY 10/01/20  Yes Sagardia, Eilleen Kempf, MD  azelastine (OPTIVAR) 0.05 % ophthalmic solution Place 1 drop into both eyes 2 (two) times daily. 01/26/20   Shade Flood, MD  fluticasone (FLONASE) 50 MCG/ACT nasal spray Place 2 sprays into both nostrils daily. 01/26/20   Shade Flood, MD      Allergies    Other    Review of Systems   Review of Systems  Respiratory:  Negative for shortness of breath.   Cardiovascular:  Negative for chest pain.  Gastrointestinal:  Negative for abdominal pain.  Neurological:  Positive for headaches.   Physical Exam Updated Vital Signs BP (!) 191/124    Pulse (!) 113    Temp 98.2 F (36.8 C) (Oral)    Resp 11    Ht 5'  9" (1.753 m)    Wt 124.7 kg    SpO2 99%    BMI 40.61 kg/m  Physical Exam Vitals and nursing note reviewed.  Constitutional:      General: He is not in acute distress.    Appearance: He is well-developed. He is not ill-appearing.  HENT:     Head: Normocephalic and atraumatic.     Nose: Nose normal.     Mouth/Throat:     Mouth: Mucous membranes are moist.  Eyes:     Extraocular Movements: Extraocular movements intact.     Conjunctiva/sclera: Conjunctivae normal.     Pupils: Pupils are equal, round, and reactive to light.  Cardiovascular:     Rate and Rhythm: Regular rhythm. Tachycardia present.     Heart sounds: No murmur heard. Pulmonary:     Effort: Pulmonary effort is normal. No respiratory distress.     Breath sounds: Normal breath sounds.  Abdominal:     Palpations: Abdomen is soft.     Tenderness: There is no abdominal tenderness.  Musculoskeletal:        General: No swelling.     Cervical back: Neck supple.  Skin:    General: Skin is warm and dry.     Capillary Refill: Capillary refill takes less than 2 seconds.  Neurological:     General: No focal deficit present.     Mental Status: He is alert  and oriented to person, place, and time.     Cranial Nerves: No cranial nerve deficit.     Sensory: No sensory deficit.     Motor: No weakness.     Coordination: Coordination normal.     Comments: 5+ out of 5 strength throughout, normal sensation, no drift, normal finger-to-nose finger  Psychiatric:        Mood and Affect: Mood normal.    ED Results / Procedures / Treatments   Labs (all labs ordered are listed, but only abnormal results are displayed) Labs Reviewed  CBC WITH DIFFERENTIAL/PLATELET - Abnormal; Notable for the following components:      Result Value   RBC 5.85 (*)    All other components within normal limits  COMPREHENSIVE METABOLIC PANEL - Abnormal; Notable for the following components:   Sodium 132 (*)    Potassium 2.9 (*)    Chloride 96 (*)     Glucose, Bld 114 (*)    Calcium 8.7 (*)    Total Protein 8.5 (*)    AST 82 (*)    ALT 79 (*)    All other components within normal limits    EKG EKG Interpretation  Date/Time:  Monday April 14 2021 21:19:46 EST Ventricular Rate:  101 PR Interval:  162 QRS Duration: 150 QT Interval:  399 QTC Calculation: 518 R Axis:   -21 Text Interpretation: Sinus tachycardia Right bundle branch block Confirmed by Virgina Norfolkuratolo, Mekesha Solomon ([id number]) on 04/14/2021 9:24:27 PM  Radiology CT Head Wo Contrast  Result Date: 04/14/2021 CLINICAL DATA:  Headache, new or worsening, neuro deficit (Age 42-49y) headache EXAM: CT HEAD WITHOUT CONTRAST TECHNIQUE: Contiguous axial images were obtained from the base of the skull through the vertex without intravenous contrast. RADIATION DOSE REDUCTION: This exam was performed according to the departmental dose-optimization program which includes automated exposure control, adjustment of the mA and/or kV according to patient size and/or use of iterative reconstruction technique. COMPARISON:  None. FINDINGS: Brain: Normal anatomic configuration of the brain. There is a linear hyperdensity seen within the right frontal cortex coursing radially. While this may represent a developmental venous anomaly, this is not optimally characterized on this examination. No acute intracranial hemorrhage or infarct. No abnormal mass effect or midline shift. No extra-axial fluid collection. Ventricular size is normal. Cerebellum unremarkable. Vascular: No hyperdense asymmetric vasculature at the skull base. 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 As this is not optimally characterized on this examination, contrast enhanced MRI/MRA would be helpful for further evaluation. Electronically Signed   By: Helyn NumbersAshesh   Parikh M.D.   On: 04/14/2021 21:45    Procedures Procedures    Medications Ordered in ED Medications  prochlorperazine (COMPAZINE) injection 10 mg (10 mg Intravenous Given 04/14/21 2203)  diphenhydrAMINE (BENADRYL) capsule 25 mg (25 mg Oral Given 04/14/21 2202)  sodium chloride 0.9 % bolus 1,000 mL (1,000 mLs Intravenous New Bag/Given 04/14/21 2202)    ED Course/ Medical Decision Making/ A&P                           Medical Decision Making Amount and/or Complexity of Data Reviewed Labs: ordered. Radiology: ordered.  Risk Prescription drug management.   Konrad PentaBobby Mosely is a 41 year old male with history of hypertension who presents to the ED with high blood pressure, feeling dehydrated.  Patient with blood  pressure 220/110 upon arrival.  Mildly tachycardic.  States that he drank alcohol yesterday and feeling sick from it.  Forced himself to throw up a couple times today.  Noticed that his blood pressure was higher than normal.  Has had a mild headache but not much of a headache currently.  Denies any weakness or numbness or chest pain.  Overall he is mostly having nausea.  He has been able to drink fluids okay.  Will treat nausea with Compazine and Benadryl which will also help with his overall discomfort.  Neurologically he is intact but given his high blood pressure and earlier headache we will get a head CT to rule out head bleed.  We will check basic labs to evaluate for differential diagnosis of dehydration versus electrolyte abnormality versus anemia.  My overall suspicion that patient dehydrated from alcohol use yesterday and not having much to eat or drink today.  Lower suspicion for head bleed or other acute process such as acute coronary syndrome and stroke.  EKG per my interpretation shows sinus tachycardia.  No ischemic changes.  Lab work including CBC and CMP were ordered.  Per my interpretation, patient with potassium of 2.9 but otherwise lab work is unremarkable.  Will give oral  repletion of potassium.  This is likely in the setting of emesis.  Head CT per my interpretation shows no head bleed.  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  Overall is not optimally characterized.  Talked with Dr. Amada Jupiter with neurology on the phone who states that this is likely the case.  Overall this is nonspecific and patient is okay to pursue this MRI and MRA outpatient.  I made patient aware of this and need for outpatient MRI and MRI.  On reevaluation is feeling much better.  Tachycardia has resolved.  Blood pressure has improved to the 180s which appears to be his baseline.  Neurologic exam is normal.  Recommend close follow-up with primary care doctor to discuss need for MRI and MRA for further blood pressure management.  Overall suspect blood pressure slightly more elevated today in the setting of not feeling well likely from alcohol use.  Told that if he develops worsening headache or worsening symptoms that he needs to return for evaluation.  Discharged in good condition.  This chart was dictated using voice recognition software.  Despite best efforts to proofread,  errors can occur which can change the documentation meaning.           Final Clinical Impression(s) / ED Diagnoses Final diagnoses:  Hypertension, unspecified type  Nausea and vomiting, unspecified vomiting type    Rx / DC Orders ED Discharge Orders     None         Virgina Norfolk, DO 04/14/21 2237

## 2021-04-14 NOTE — ED Triage Notes (Signed)
Hypertension. States he was celebrating football yesterday and drinking alcohol. He feels he is dehydrated.

## 2021-07-07 ENCOUNTER — Other Ambulatory Visit: Payer: Self-pay | Admitting: Family Medicine

## 2021-07-07 DIAGNOSIS — J309 Allergic rhinitis, unspecified: Secondary | ICD-10-CM

## 2021-07-07 DIAGNOSIS — H1013 Acute atopic conjunctivitis, bilateral: Secondary | ICD-10-CM

## 2021-08-28 ENCOUNTER — Other Ambulatory Visit: Payer: Self-pay

## 2021-08-28 ENCOUNTER — Encounter (HOSPITAL_BASED_OUTPATIENT_CLINIC_OR_DEPARTMENT_OTHER): Payer: Self-pay | Admitting: Emergency Medicine

## 2021-08-28 ENCOUNTER — Emergency Department (HOSPITAL_BASED_OUTPATIENT_CLINIC_OR_DEPARTMENT_OTHER)
Admission: EM | Admit: 2021-08-28 | Discharge: 2021-08-28 | Disposition: A | Discharge status: Home / Self Care | Payer: BLUE CROSS/BLUE SHIELD | Attending: Emergency Medicine | Admitting: Emergency Medicine

## 2021-08-28 ENCOUNTER — Emergency Department (HOSPITAL_BASED_OUTPATIENT_CLINIC_OR_DEPARTMENT_OTHER): Payer: BLUE CROSS/BLUE SHIELD

## 2021-08-28 DIAGNOSIS — I10 Essential (primary) hypertension: Secondary | ICD-10-CM | POA: Diagnosis not present

## 2021-08-28 DIAGNOSIS — Q279 Congenital malformation of peripheral vascular system, unspecified: Secondary | ICD-10-CM

## 2021-08-28 DIAGNOSIS — Z79899 Other long term (current) drug therapy: Secondary | ICD-10-CM | POA: Insufficient documentation

## 2021-08-28 DIAGNOSIS — Q283 Other malformations of cerebral vessels: Secondary | ICD-10-CM | POA: Insufficient documentation

## 2021-08-28 DIAGNOSIS — R519 Headache, unspecified: Secondary | ICD-10-CM | POA: Diagnosis present

## 2021-08-28 LAB — CBC WITH DIFFERENTIAL/PLATELET
Abs Immature Granulocytes: 0.02 10*3/uL (ref 0.00–0.07)
Basophils Absolute: 0 10*3/uL (ref 0.0–0.1)
Basophils Relative: 1 %
Eosinophils Absolute: 0.1 10*3/uL (ref 0.0–0.5)
Eosinophils Relative: 1 %
HCT: 48 % (ref 39.0–52.0)
Hemoglobin: 17 g/dL (ref 13.0–17.0)
Immature Granulocytes: 0 %
Lymphocytes Relative: 24 %
Lymphs Abs: 1.8 10*3/uL (ref 0.7–4.0)
MCH: 28.6 pg (ref 26.0–34.0)
MCHC: 35.4 g/dL (ref 30.0–36.0)
MCV: 80.7 fL (ref 80.0–100.0)
Monocytes Absolute: 0.5 10*3/uL (ref 0.1–1.0)
Monocytes Relative: 6 %
Neutro Abs: 5.2 10*3/uL (ref 1.7–7.7)
Neutrophils Relative %: 68 %
Platelets: 280 10*3/uL (ref 150–400)
RBC: 5.95 MIL/uL — ABNORMAL HIGH (ref 4.22–5.81)
RDW: 12.7 % (ref 11.5–15.5)
WBC: 7.6 10*3/uL (ref 4.0–10.5)
nRBC: 0 % (ref 0.0–0.2)

## 2021-08-28 LAB — COMPREHENSIVE METABOLIC PANEL
ALT: 63 U/L — ABNORMAL HIGH (ref 0–44)
AST: 65 U/L — ABNORMAL HIGH (ref 15–41)
Albumin: 4.5 g/dL (ref 3.5–5.0)
Alkaline Phosphatase: 64 U/L (ref 38–126)
Anion gap: 8 (ref 5–15)
BUN: 7 mg/dL (ref 6–20)
CO2: 29 mmol/L (ref 22–32)
Calcium: 9.2 mg/dL (ref 8.9–10.3)
Chloride: 104 mmol/L (ref 98–111)
Creatinine, Ser: 0.96 mg/dL (ref 0.61–1.24)
GFR, Estimated: >60 mL/min (ref >60)
Glucose, Bld: 105 mg/dL — ABNORMAL HIGH (ref 70–99)
Potassium: 3 mmol/L — ABNORMAL LOW (ref 3.5–5.1)
Sodium: 141 mmol/L (ref 135–145)
Total Bilirubin: 1.1 mg/dL (ref 0.3–1.2)
Total Protein: 8.5 g/dL — ABNORMAL HIGH (ref 6.5–8.1)

## 2021-08-28 LAB — ETHANOL: Alcohol, Ethyl (B): <10 mg/dL (ref <10)

## 2021-08-28 LAB — TROPONIN I (HIGH SENSITIVITY): Troponin I (High Sensitivity): 4 ng/L (ref <18)

## 2021-08-28 MED ORDER — HYDRALAZINE HCL 20 MG/ML IJ SOLN
10.0000 mg | Freq: Once | INTRAMUSCULAR | Status: AC
  Administered 2021-08-28: 10 mg via INTRAVENOUS
  Filled 2021-08-28: qty 1

## 2021-08-28 MED ORDER — AMLODIPINE BESYLATE 5 MG PO TABS: 10.0000 mg | ORAL_TABLET | Freq: Once | ORAL | Status: DC

## 2021-08-28 MED ORDER — POTASSIUM CHLORIDE CRYS ER 20 MEQ PO TBCR
40.0000 meq | EXTENDED_RELEASE_TABLET | Freq: Once | ORAL | Status: AC
  Administered 2021-08-28: 40 meq via ORAL
  Filled 2021-08-28: qty 2

## 2021-08-28 MED ORDER — SODIUM CHLORIDE 0.9 % IV BOLUS
1000.0000 mL | Freq: Once | INTRAVENOUS | Status: AC
  Administered 2021-08-28: 1000 mL via INTRAVENOUS

## 2021-08-28 MED ORDER — LOSARTAN POTASSIUM 25 MG PO TABS
50.0000 mg | ORAL_TABLET | Freq: Once | ORAL | Status: DC
Start: 2021-08-28 — End: 2021-08-28

## 2021-08-28 NOTE — ED Triage Notes (Signed)
Pt states feels dehydrated and like BP is high.

## 2021-08-28 NOTE — Discharge Instructions (Addendum)
Your blood pressure is elevated.  You need to take your blood pressure medicines as prescribed and keep a log of your blood pressure every day.  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  I recommend an outpatient MRI with your doctor  Return to ER if you have worse headache, chest pain, weakness or numbness or trouble speaking

## 2021-08-28 NOTE — ED Provider Notes (Signed)
[facility] Provider Note   CSN: [id number] Arrival date & time: 08/28/21  1944     History  Chief Complaint  Patient presents with   Hypertension    Antonio Rodgers is a 41 y.o. male history of hypertension, alcohol abuse here presenting with headache and hypertension.  Patient states that He was drinking a lot of alcohol 2 days ago and has been having some headache and dizziness since then.  He states that he missed several doses of his blood pressure medicine but did take some today.  Patient was seen here several months ago and had a CT that showed possible venous malformation that was chronic and was told to follow-up to get an outpatient MRI but never got it.   The history is provided by the patient.       Home Medications Prior to Admission medications   Medication Sig Start Date End Date Taking? Authorizing Provider  amLODipine (NORVASC) 10 MG tablet TAKE 1 TABLET BY MOUTH EVERY DAY 10/01/20   Horald Pollen, MD  azelastine (OPTIVAR) 0.05 % ophthalmic solution Place 1 drop into both eyes 2 (two) times daily. 01/26/20   Wendie Agreste, MD  fluticasone (FLONASE) 50 MCG/ACT nasal spray Place 2 sprays into both nostrils daily. 01/26/20   Wendie Agreste, MD  losartan (COZAAR) 50 MG tablet TAKE 1 TABLET BY MOUTH EVERY DAY 10/01/20   Horald Pollen, MD  rosuvastatin ([place]) 20 MG tablet TAKE 1 TABLET BY MOUTH EVERY DAY 10/01/20   Horald Pollen, MD      Allergies    Other    Review of Systems   Review of Systems  Neurological:  Positive for dizziness and headaches.  All other systems reviewed and are negative.   Physical Exam Updated Vital Signs BP (!) 195/112   Pulse 94   Temp 98.6 F (37 C) (Oral)   Resp (!) 22   Ht 5\' 9"  (1.753 m)   Wt 129.3 kg   SpO2 98%   BMI 42.09 kg/m  Physical Exam Vitals and nursing note reviewed.  Constitutional:      Appearance: Normal appearance.  HENT:     Head: Normocephalic.      Nose: Nose normal.     Mouth/Throat:     Mouth: Mucous membranes are dry.  Eyes:     Extraocular Movements: Extraocular movements intact.     Pupils: Pupils are equal, round, and reactive to light.  Cardiovascular:     Rate and Rhythm: Normal rate and regular rhythm.     Pulses: Normal pulses.     Heart sounds: Normal heart sounds.  Pulmonary:     Effort: Pulmonary effort is normal.     Breath sounds: Normal breath sounds.  Abdominal:     General: Abdomen is flat. Bowel sounds are normal.     Palpations: Abdomen is soft.  Musculoskeletal:        General: Normal range of motion.     Cervical back: Normal range of motion and neck supple.  Skin:    General: Skin is warm.     Capillary Refill: Capillary refill takes less than 2 seconds.  Neurological:     General: No focal deficit present.     Mental Status: He is alert and oriented to person, place, and time.     Cranial Nerves: No cranial nerve deficit.     Motor: No weakness.  Psychiatric:        Mood and  Affect: Mood normal.        Behavior: Behavior normal.     ED Results / Procedures / Treatments   Labs (all labs ordered are listed, but only abnormal results are displayed) Labs Reviewed  CBC WITH DIFFERENTIAL/PLATELET - Abnormal; Notable for the following components:      Result Value   RBC 5.95 (*)    All other components within normal limits  COMPREHENSIVE METABOLIC PANEL  ETHANOL  TROPONIN I (HIGH SENSITIVITY)    EKG None  Radiology CT Head Wo Contrast  Result Date: 08/28/2021 CLINICAL DATA:  Headache EXAM: CT HEAD WITHOUT CONTRAST TECHNIQUE: Contiguous axial images were obtained from the base of the skull through the vertex without intravenous contrast. RADIATION DOSE REDUCTION: This exam was performed according to the departmental dose-optimization program which includes automated exposure control, adjustment of the mA and/or kV according to patient size and/or use of iterative reconstruction technique.  COMPARISON:  CT brain 04/14/2021 FINDINGS: Brain: No acute territorial infarction, hemorrhage or intracranial mass. The ventricles are nonenlarged. Vascular: No hyperdense vessels. No unexpected calcification. Linear hyperdensity in the right frontal lobe extending towards the cortex, series 2 image 14, suspect that this represents a developmental venous anomaly. Skull: Normal. Negative for fracture or focal lesion. Sinuses/Orbits: No acute finding. Other: None IMPRESSION: 1. No CT evidence for acute intracranial abnormality. 2. 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 This could be confirmed with MRI on a nonemergent basis. Electronically Signed   By: Donavan Foil M.D.   On: 08/28/2021 21:18    Procedures Procedures    Medications Ordered in ED Medications  sodium chloride 0.9 % bolus 1,000 mL (1,000 mLs Intravenous New Bag/Given 08/28/21 2108)  hydrALAZINE (APRESOLINE) injection 10 mg (10 mg Intravenous Given 08/28/21 2109)    ED Course/ Medical Decision Making/ A&P                           Medical Decision Making [name] is a 41 y.o. male here presenting with dizziness and hypertension. Patient did miss several doses of his BP meds and did drink some alcohol. Wonder if this is from medication noncompliance versus dehydration.  Patient did had a previous CT that showed possible chronic venous malformation and has some headaches. We will get a CT head to rule out hypertensive bleed.  We will also get blood work and give hydralazine for hypertensive urgency  11:08 PM I reviewed patient's labs and independently reviewed his imaging.  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  Patient's blood pressure is down to 170s now.  Since he is noncompliant with his Norvasc and Cozaar, I hesitate to adjust his BP meds.  He has a PCP already.  I told him that he needs to take his blood pressure medicines and follow-up with his primary  care doctor.  He has venous anomaly that can be evaluated outpatient.   Problems Addressed: Hypertension, unspecified type: acute illness or injury Venous anomaly: acute illness or injury  Amount and/or Complexity of Data Reviewed Labs: ordered. Decision-making details documented in ED Course. Radiology: ordered and independent interpretation performed. Decision-making details documented in ED Course. ECG/medicine tests: ordered and independent interpretation performed. Decision-making details documented in ED Course.  Risk Prescription drug management.    Final Clinical Impression(s) / ED Diagnoses Final diagnoses:  None    Rx / DC Orders ED Discharge Orders     None  Drenda Freeze, MD 08/28/21 [phone]

## 2021-09-02 ENCOUNTER — Ambulatory Visit (INDEPENDENT_AMBULATORY_CARE_PROVIDER_SITE_OTHER): Payer: BLUE CROSS/BLUE SHIELD | Admitting: Emergency Medicine

## 2021-09-02 ENCOUNTER — Encounter: Payer: Self-pay | Admitting: Emergency Medicine

## 2021-09-02 VITALS — BP 200/120 | HR 84 | Temp 98.0°F | Ht 69.0 in | Wt 293.2 lb

## 2021-09-02 DIAGNOSIS — R9431 Abnormal electrocardiogram [ECG] [EKG]: Secondary | ICD-10-CM

## 2021-09-02 DIAGNOSIS — I10 Essential (primary) hypertension: Secondary | ICD-10-CM

## 2021-09-02 DIAGNOSIS — I452 Bifascicular block: Secondary | ICD-10-CM | POA: Diagnosis not present

## 2021-09-02 MED ORDER — VALSARTAN-HYDROCHLOROTHIAZIDE 160-12.5 MG PO TABS: 1.0000 | ORAL_TABLET | Freq: Every day | ORAL | 3 refills | Status: DC

## 2021-09-02 NOTE — Progress Notes (Signed)
Antonio Rodgers 41 y.o.   Chief Complaint  Patient presents with   Hypertension    Blood pressure issue, BP running too high     HISTORY OF PRESENT ILLNESS: This is a 41 y.o. male here for follow-up of uncontrolled hypertension. Was seen in the emergency room on 08/28/2021 with uncontrolled hypertension. Work-up was unremarkable.  CT scan of head showed chronic findings. EKG unchanged from previous one but abnormal with bifascicular block. Asymptomatic. BP Readings from Last 3 Encounters:  09/02/21 (!) 200/120  08/28/21 (!) 189/103  04/14/21 (!) 194/107     Hypertension Pertinent negatives include no blurred vision, chest pain, headaches, palpitations or shortness of breath.     Prior to Admission medications   Medication Sig Start Date End Date Taking? Authorizing Provider  amLODipine (NORVASC) 10 MG tablet TAKE 1 TABLET BY MOUTH EVERY DAY 10/01/20  Yes Mical Kicklighter, Eilleen KempfMiguel Jose, MD  azelastine (OPTIVAR) 0.05 % ophthalmic solution Place 1 drop into both eyes 2 (two) times daily. 01/26/20  Yes Shade FloodGreene, Jeffrey R, MD  fluticasone (FLONASE) 50 MCG/ACT nasal spray Place 2 sprays into both nostrils daily. 01/26/20  Yes Shade FloodGreene, Jeffrey R, MD  rosuvastatin ([place]) 20 MG tablet TAKE 1 TABLET BY MOUTH EVERY DAY 10/01/20  Yes Lelani Garnett, Eilleen KempfMiguel Jose, MD  losartan (COZAAR) 50 MG tablet TAKE 1 TABLET BY MOUTH EVERY DAY Patient not taking: Reported on 09/02/2021 10/01/20   Georgina QuintSagardia, Jaclene Bartelt Jose, MD    Allergies  Allergen Reactions   Other Other (See Comments)    Family history of malignant hyperthermia under general anesthesia; pt himself has not undergone general anesthesia    Patient Active Problem List   Diagnosis Date Noted   Essential hypertension 11/18/2014    Past Medical History:  Diagnosis Date   Hypertension     Past Surgical History:  Procedure Laterality Date   KNEE SURGERY      Social History   Socioeconomic History   Marital status: Single    Spouse name: Not on file    Number of children: Not on file   Years of education: Not on file   Highest education level: Not on file  Occupational History   Not on file  Tobacco Use   Smoking status: Never   Smokeless tobacco: Never  Vaping Use   Vaping Use: Never used  Substance and Sexual Activity   Alcohol use: Yes    Alcohol/week: 0.0 standard drinks of alcohol    Comment: Weekends   Drug use: No   Sexual activity: Not on file  Other Topics Concern   Not on file  Social History Narrative   Not on file   Social Determinants of Health   Financial Resource Strain: Not on file  Food Insecurity: Not on file  Transportation Needs: Not on file  Physical Activity: Not on file  Stress: Not on file  Social Connections: Not on file  Intimate Partner Violence: Not on file    No family history on file.   Review of Systems  Constitutional: Negative.  Negative for chills and fever.  HENT: Negative.  Negative for congestion and sore throat.   Eyes: Negative.  Negative for blurred vision and double vision.  Respiratory: Negative.  Negative for cough and shortness of breath.   Cardiovascular: Negative.  Negative for chest pain and palpitations.  Gastrointestinal: Negative.  Negative for abdominal pain, blood in stool, diarrhea, nausea and vomiting.  Genitourinary: Negative.  Negative for dysuria and hematuria.  Musculoskeletal: Negative.   Skin: Negative.  Negative for rash.  Neurological: Negative.  Negative for dizziness and headaches.  All other systems reviewed and are negative.   Today's Vitals   09/02/21 1041 09/02/21 1046  BP: (!) 200/120 (!) 200/120  Pulse: 84   Temp: 98 F (36.7 C)   TempSrc: Oral   SpO2: 98%   Weight: 293 lb 4 oz (133 kg)   Height: 5\' 9"  (1.753 m)    Body mass index is 43.31 kg/m.  Physical Exam Vitals reviewed.  Constitutional:      Appearance: Normal appearance. He is obese.  HENT:     Head: Normocephalic.     Mouth/Throat:     Mouth: Mucous membranes are  moist.     Pharynx: Oropharynx is clear.  Eyes:     Extraocular Movements: Extraocular movements intact.     Conjunctiva/sclera: Conjunctivae normal.     Pupils: Pupils are equal, round, and reactive to light.  Cardiovascular:     Rate and Rhythm: Normal rate and regular rhythm.     Pulses: Normal pulses.     Heart sounds: Normal heart sounds.  Pulmonary:     Effort: Pulmonary effort is normal.     Breath sounds: Normal breath sounds.  Abdominal:     Palpations: Abdomen is soft.     Tenderness: There is no abdominal tenderness.  Musculoskeletal:     Cervical back: No tenderness.     Right lower leg: No edema.     Left lower leg: No edema.  Lymphadenopathy:     Cervical: No cervical adenopathy.  Skin:    General: Skin is warm and dry.     Capillary Refill: Capillary refill takes less than 2 seconds.  Neurological:     General: No focal deficit present.     Mental Status: He is alert and oriented to person, place, and time.  Psychiatric:        Mood and Affect: Mood normal.        Behavior: Behavior normal.      ASSESSMENT & PLAN: A total of 52-minutes was spent with the patient and counseling/coordination of care regarding preparing for this visit, review of most recent office visit notes, review of most recent emergency department visit notes, review of most recent imaging reports, report of most recent EKG report, review of most recent blood work results, diagnosis and management of hypertension, review of all medications and changes made, cardiovascular risks associated with uncontrolled hypertension, need for cardiology evaluation, education on nutrition, prognosis, documentation, need for follow-up in 4 weeks.  Problem List Items Addressed This Visit       Cardiovascular and Mediastinum   Essential hypertension - Primary    Uncontrolled hypertension with recent emergency department visit. Continue amlodipine 10 mg daily and start valsartan-HCTZ 160-12.5 mg  daily. Abnormal EKG with bifascicular block. Needs cardiology evaluation for possible hypertensive heart disease. Advised to monitor blood pressure readings at home daily for the next several weeks and keep a log. Dietary approaches to stop hypertension discussed. Continue rosuvastatin 20 mg daily. Follow-up in 4 weeks.      Relevant Medications   valsartan-hydrochlorothiazide (DIOVAN-HCT) 160-12.5 MG tablet   Other Visit Diagnoses     Uncontrolled hypertension       Relevant Medications   valsartan-hydrochlorothiazide (DIOVAN-HCT) 160-12.5 MG tablet   Abnormal EKG       Relevant Orders   Ambulatory referral to Cardiology   Bifascicular bundle branch block       Relevant Medications   valsartan-hydrochlorothiazide (  DIOVAN-HCT) 160-12.5 MG tablet   Other Relevant Orders   Ambulatory referral to Cardiology      Patient Instructions  Hypertension, Adult High blood pressure (hypertension) is when the force of blood pumping through the arteries is too strong. The arteries are the blood vessels that carry blood from the heart throughout the body. Hypertension forces the heart to work harder to pump blood and may cause arteries to become narrow or stiff. Untreated or uncontrolled hypertension can lead to a heart attack, heart failure, a stroke, kidney disease, and other problems. A blood pressure reading consists of a higher number over a lower number. Ideally, your blood pressure should be below 120/80. The first ("top") number is called the systolic pressure. It is a measure of the pressure in your arteries as your heart beats. The second ("bottom") number is called the diastolic pressure. It is a measure of the pressure in your arteries as the heart relaxes. What are the causes? The exact cause of this condition is not known. There are some conditions that result in high blood pressure. What increases the risk? Certain factors may make you more likely to develop high blood pressure.  Some of these risk factors are under your control, including: Smoking. Not getting enough exercise or physical activity. Being overweight. Having too much fat, sugar, calories, or salt (sodium) in your diet. Drinking too much alcohol. Other risk factors include: Having a personal history of heart disease, diabetes, high cholesterol, or kidney disease. Stress. Having a family history of high blood pressure and high cholesterol. Having obstructive sleep apnea. Age. The risk increases with age. What are the signs or symptoms? High blood pressure may not cause symptoms. Very high blood pressure (hypertensive crisis) may cause: Headache. Fast or irregular heartbeats (palpitations). Shortness of breath. Nosebleed. Nausea and vomiting. Vision changes. Severe chest pain, dizziness, and seizures. How is this diagnosed? This condition is diagnosed by measuring your blood pressure while you are seated, with your arm resting on a flat surface, your legs uncrossed, and your feet flat on the floor. The cuff of the blood pressure monitor will be placed directly against the skin of your upper arm at the level of your heart. Blood pressure should be measured at least twice using the same arm. Certain conditions can cause a difference in blood pressure between your right and left arms. If you have a high blood pressure reading during one visit or you have normal blood pressure with other risk factors, you may be asked to: Return on a different day to have your blood pressure checked again. Monitor your blood pressure at home for 1 week or longer. If you are diagnosed with hypertension, you may have other blood or imaging tests to help your health care provider understand your overall risk for other conditions. How is this treated? This condition is treated by making healthy lifestyle changes, such as eating healthy foods, exercising more, and reducing your alcohol intake. You may be referred for counseling  on a healthy diet and physical activity. Your health care provider may prescribe medicine if lifestyle changes are not enough to get your blood pressure under control and if: Your systolic blood pressure is above 130. Your diastolic blood pressure is above 80. Your personal target blood pressure may vary depending on your medical conditions, your age, and other factors. Follow these instructions at home: Eating and drinking  Eat a diet that is high in fiber and potassium, and low in sodium, added sugar, and fat.  An example of this eating plan is called the DASH diet. DASH stands for Dietary Approaches to Stop Hypertension. To eat this way: Eat plenty of fresh fruits and vegetables. Try to fill one half of your plate at each meal with fruits and vegetables. Eat whole grains, such as whole-wheat pasta, brown rice, or whole-grain bread. Fill about one fourth of your plate with whole grains. Eat or drink low-fat dairy products, such as skim milk or low-fat yogurt. Avoid fatty cuts of meat, processed or cured meats, and poultry with skin. Fill about one fourth of your plate with lean proteins, such as fish, chicken without skin, beans, eggs, or tofu. Avoid pre-made and processed foods. These tend to be higher in sodium, added sugar, and fat. Reduce your daily sodium intake. Many people with hypertension should eat less than 1,500 mg of sodium a day. Do not drink alcohol if: Your health care provider tells you not to drink. You are pregnant, may be pregnant, or are planning to become pregnant. If you drink alcohol: Limit how much you have to: 0-1 drink a day for women. 0-2 drinks a day for men. Know how much alcohol is in your drink. In the [place] one drink equals one 12 oz bottle of beer (355 mL), one 5 oz glass of wine (148 mL), or one 1 oz glass of hard liquor (44 mL). Lifestyle  Work with your health care provider to maintain a healthy body weight or to lose weight. Ask what an ideal weight is  for you. Get at least 30 minutes of exercise that causes your heart to beat faster (aerobic exercise) most days of the week. Activities may include walking, swimming, or biking. Include exercise to strengthen your muscles (resistance exercise), such as Pilates or lifting weights, as part of your weekly exercise routine. Try to do these types of exercises for 30 minutes at least 3 days a week. Do not use any products that contain nicotine or tobacco. These products include cigarettes, chewing tobacco, and vaping devices, such as e-cigarettes. If you need help quitting, ask your health care provider. Monitor your blood pressure at home as told by your health care provider. Keep all follow-up visits. This is important. Medicines Take over-the-counter and prescription medicines only as told by your health care provider. Follow directions carefully. Blood pressure medicines must be taken as prescribed. Do not skip doses of blood pressure medicine. Doing this puts you at risk for problems and can make the medicine less effective. Ask your health care provider about side effects or reactions to medicines that you should watch for. Contact a health care provider if you: Think you are having a reaction to a medicine you are taking. Have headaches that keep coming back (recurring). Feel dizzy. Have swelling in your ankles. Have trouble with your vision. Get help right away if you: Develop a severe headache or confusion. Have unusual weakness or numbness. Feel faint. Have severe pain in your chest or abdomen. Vomit repeatedly. Have trouble breathing. These symptoms may be an emergency. Get help right away. Call 911. Do not wait to see if the symptoms will go away. Do not drive yourself to the hospital. Summary Hypertension is when the force of blood pumping through your arteries is too strong. If this condition is not controlled, it may put you at risk for serious complications. Your personal target  blood pressure may vary depending on your medical conditions, your age, and other factors. For most people, a normal blood pressure  is less than 120/80. Hypertension is treated with lifestyle changes, medicines, or a combination of both. Lifestyle changes include losing weight, eating a healthy, low-sodium diet, exercising more, and limiting alcohol. This information is not intended to replace advice given to you by your health care provider. Make sure you discuss any questions you have with your health care provider. Document Revised: 01/14/2021 Document Reviewed: 01/14/2021 Elsevier Patient Education  2023 [employer]      Edwina Barth, MD  Primary Care at [facility]

## 2021-09-02 NOTE — Patient Instructions (Signed)
Hypertension, Adult High blood pressure (hypertension) is when the force of blood pumping through the arteries is too strong. The arteries are the blood vessels that carry blood from the heart throughout the body. Hypertension forces the heart to work harder to pump blood and may cause arteries to become narrow or stiff. Untreated or uncontrolled hypertension can lead to a heart attack, heart failure, a stroke, kidney disease, and other problems. A blood pressure reading consists of a higher number over a lower number. Ideally, your blood pressure should be below 120/80. The first ("top") number is called the systolic pressure. It is a measure of the pressure in your arteries as your heart beats. The second ("bottom") number is called the diastolic pressure. It is a measure of the pressure in your arteries as the heart relaxes. What are the causes? The exact cause of this condition is not known. There are some conditions that result in high blood pressure. What increases the risk? Certain factors may make you more likely to develop high blood pressure. Some of these risk factors are under your control, including: Smoking. Not getting enough exercise or physical activity. Being overweight. Having too much fat, sugar, calories, or salt (sodium) in your diet. Drinking too much alcohol. Other risk factors include: Having a personal history of heart disease, diabetes, high cholesterol, or kidney disease. Stress. Having a family history of high blood pressure and high cholesterol. Having obstructive sleep apnea. Age. The risk increases with age. What are the signs or symptoms? High blood pressure may not cause symptoms. Very high blood pressure (hypertensive crisis) may cause: Headache. Fast or irregular heartbeats (palpitations). Shortness of breath. Nosebleed. Nausea and vomiting. Vision changes. Severe chest pain, dizziness, and seizures. How is this diagnosed? This condition is diagnosed by  measuring your blood pressure while you are seated, with your arm resting on a flat surface, your legs uncrossed, and your feet flat on the floor. The cuff of the blood pressure monitor will be placed directly against the skin of your upper arm at the level of your heart. Blood pressure should be measured at least twice using the same arm. Certain conditions can cause a difference in blood pressure between your right and left arms. If you have a high blood pressure reading during one visit or you have normal blood pressure with other risk factors, you may be asked to: Return on a different day to have your blood pressure checked again. Monitor your blood pressure at home for 1 week or longer. If you are diagnosed with hypertension, you may have other blood or imaging tests to help your health care provider understand your overall risk for other conditions. How is this treated? This condition is treated by making healthy lifestyle changes, such as eating healthy foods, exercising more, and reducing your alcohol intake. You may be referred for counseling on a healthy diet and physical activity. Your health care provider may prescribe medicine if lifestyle changes are not enough to get your blood pressure under control and if: Your systolic blood pressure is above 130. Your diastolic blood pressure is above 80. Your personal target blood pressure may vary depending on your medical conditions, your age, and other factors. Follow these instructions at home: Eating and drinking  Eat a diet that is high in fiber and potassium, and low in sodium, added sugar, and fat. An example of this eating plan is called the DASH diet. DASH stands for Dietary Approaches to Stop Hypertension. To eat this way: Eat   plenty of fresh fruits and vegetables. Try to fill one half of your plate at each meal with fruits and vegetables. Eat whole grains, such as whole-wheat pasta, brown rice, or whole-grain bread. Fill about one  fourth of your plate with whole grains. Eat or drink low-fat dairy products, such as skim milk or low-fat yogurt. Avoid fatty cuts of meat, processed or cured meats, and poultry with skin. Fill about one fourth of your plate with lean proteins, such as fish, chicken without skin, beans, eggs, or tofu. Avoid pre-made and processed foods. These tend to be higher in sodium, added sugar, and fat. Reduce your daily sodium intake. Many people with hypertension should eat less than 1,500 mg of sodium a day. Do not drink alcohol if: Your health care provider tells you not to drink. You are pregnant, may be pregnant, or are planning to become pregnant. If you drink alcohol: Limit how much you have to: 0-1 drink a day for women. 0-2 drinks a day for men. Know how much alcohol is in your drink. In the [place] one drink equals one 12 oz bottle of beer (355 mL), one 5 oz glass of wine (148 mL), or one 1 oz glass of hard liquor (44 mL). Lifestyle  Work with your health care provider to maintain a healthy body weight or to lose weight. Ask what an ideal weight is for you. Get at least 30 minutes of exercise that causes your heart to beat faster (aerobic exercise) most days of the week. Activities may include walking, swimming, or biking. Include exercise to strengthen your muscles (resistance exercise), such as Pilates or lifting weights, as part of your weekly exercise routine. Try to do these types of exercises for 30 minutes at least 3 days a week. Do not use any products that contain nicotine or tobacco. These products include cigarettes, chewing tobacco, and vaping devices, such as e-cigarettes. If you need help quitting, ask your health care provider. Monitor your blood pressure at home as told by your health care provider. Keep all follow-up visits. This is important. Medicines Take over-the-counter and prescription medicines only as told by your health care provider. Follow directions carefully. Blood  pressure medicines must be taken as prescribed. Do not skip doses of blood pressure medicine. Doing this puts you at risk for problems and can make the medicine less effective. Ask your health care provider about side effects or reactions to medicines that you should watch for. Contact a health care provider if you: Think you are having a reaction to a medicine you are taking. Have headaches that keep coming back (recurring). Feel dizzy. Have swelling in your ankles. Have trouble with your vision. Get help right away if you: Develop a severe headache or confusion. Have unusual weakness or numbness. Feel faint. Have severe pain in your chest or abdomen. Vomit repeatedly. Have trouble breathing. These symptoms may be an emergency. Get help right away. Call 911. Do not wait to see if the symptoms will go away. Do not drive yourself to the hospital. Summary Hypertension is when the force of blood pumping through your arteries is too strong. If this condition is not controlled, it may put you at risk for serious complications. Your personal target blood pressure may vary depending on your medical conditions, your age, and other factors. For most people, a normal blood pressure is less than 120/80. Hypertension is treated with lifestyle changes, medicines, or a combination of both. Lifestyle changes include losing weight, eating a healthy,   low-sodium diet, exercising more, and limiting alcohol. This information is not intended to replace advice given to you by your health care provider. Make sure you discuss any questions you have with your health care provider. Document Revised: 01/14/2021 Document Reviewed: 01/14/2021 Elsevier Patient Education  2023 [employer]  

## 2021-09-02 NOTE — Assessment & Plan Note (Signed)
Uncontrolled hypertension with recent emergency department visit. Continue amlodipine 10 mg daily and start valsartan-HCTZ 160-12.5 mg daily. Abnormal EKG with bifascicular block. Needs cardiology evaluation for possible hypertensive heart disease. Advised to monitor blood pressure readings at home daily for the next several weeks and keep a log. Dietary approaches to stop hypertension discussed. Continue rosuvastatin 20 mg daily. Follow-up in 4 weeks.

## 2021-09-16 ENCOUNTER — Ambulatory Visit: Payer: BLUE CROSS/BLUE SHIELD | Admitting: Cardiovascular Disease

## 2021-09-17 ENCOUNTER — Inpatient Hospital Stay: Payer: BLUE CROSS/BLUE SHIELD | Admitting: Emergency Medicine

## 2021-09-25 ENCOUNTER — Inpatient Hospital Stay: Payer: BLUE CROSS/BLUE SHIELD | Admitting: Emergency Medicine

## 2021-10-01 ENCOUNTER — Encounter: Payer: Self-pay | Admitting: Emergency Medicine

## 2021-10-01 ENCOUNTER — Ambulatory Visit (INDEPENDENT_AMBULATORY_CARE_PROVIDER_SITE_OTHER): Payer: BLUE CROSS/BLUE SHIELD | Admitting: Emergency Medicine

## 2021-10-01 VITALS — BP 178/100 | HR 73 | Temp 98.1°F | Ht 69.0 in | Wt 287.4 lb

## 2021-10-01 DIAGNOSIS — I10 Essential (primary) hypertension: Secondary | ICD-10-CM

## 2021-10-01 LAB — COMPREHENSIVE METABOLIC PANEL
ALT: 41 U/L (ref 0–53)
AST: 32 U/L (ref 0–37)
Albumin: 4.6 g/dL (ref 3.5–5.2)
Alkaline Phosphatase: 61 U/L (ref 39–117)
BUN: 12 mg/dL (ref 6–23)
CO2: 31 mEq/L (ref 19–32)
Calcium: 9.3 mg/dL (ref 8.4–10.5)
Chloride: 102 mEq/L (ref 96–112)
Creatinine, Ser: 1.1 mg/dL (ref 0.40–1.50)
GFR: 83.36 mL/min (ref >60.00)
Glucose, Bld: 152 mg/dL — ABNORMAL HIGH (ref 70–99)
Potassium: 3.5 mEq/L (ref 3.5–5.1)
Sodium: 140 mEq/L (ref 135–145)
Total Bilirubin: 0.6 mg/dL (ref 0.2–1.2)
Total Protein: 7.2 g/dL (ref 6.0–8.3)

## 2021-10-01 LAB — LIPID PANEL
Cholesterol: 119 mg/dL (ref 0–200)
HDL: 29.2 mg/dL — ABNORMAL LOW (ref >39.00)
LDL Cholesterol: 61 mg/dL (ref 0–99)
NonHDL: 89.56
Total CHOL/HDL Ratio: 4
Triglycerides: 141 mg/dL (ref 0.0–149.0)
VLDL: 28.2 mg/dL (ref 0.0–40.0)

## 2021-10-01 MED ORDER — VALSARTAN-HYDROCHLOROTHIAZIDE 320-12.5 MG PO TABS: 1.0000 | ORAL_TABLET | Freq: Every day | ORAL | 3 refills | Status: DC

## 2021-10-01 NOTE — Patient Instructions (Signed)
Continue amlodipine 10 mg daily. Increase dose of valsartan-HCTZ to 320-12.5 mg.  New prescription sent to pharmacy of record Monitor blood pressure readings at home daily for several weeks and keep a log.  Contact the office if numbers persistently high. Follow-up in 4 weeks after cardiology evaluation.  Hypertension, Adult High blood pressure (hypertension) is when the force of blood pumping through the arteries is too strong. The arteries are the blood vessels that carry blood from the heart throughout the body. Hypertension forces the heart to work harder to pump blood and may cause arteries to become narrow or stiff. Untreated or uncontrolled hypertension can lead to a heart attack, heart failure, a stroke, kidney disease, and other problems. A blood pressure reading consists of a higher number over a lower number. Ideally, your blood pressure should be below 120/80. The first ("top") number is called the systolic pressure. It is a measure of the pressure in your arteries as your heart beats. The second ("bottom") number is called the diastolic pressure. It is a measure of the pressure in your arteries as the heart relaxes. What are the causes? The exact cause of this condition is not known. There are some conditions that result in high blood pressure. What increases the risk? Certain factors may make you more likely to develop high blood pressure. Some of these risk factors are under your control, including: Smoking. Not getting enough exercise or physical activity. Being overweight. Having too much fat, sugar, calories, or salt (sodium) in your diet. Drinking too much alcohol. Other risk factors include: Having a personal history of heart disease, diabetes, high cholesterol, or kidney disease. Stress. Having a family history of high blood pressure and high cholesterol. Having obstructive sleep apnea. Age. The risk increases with age. What are the signs or symptoms? High blood pressure  may not cause symptoms. Very high blood pressure (hypertensive crisis) may cause: Headache. Fast or irregular heartbeats (palpitations). Shortness of breath. Nosebleed. Nausea and vomiting. Vision changes. Severe chest pain, dizziness, and seizures. How is this diagnosed? This condition is diagnosed by measuring your blood pressure while you are seated, with your arm resting on a flat surface, your legs uncrossed, and your feet flat on the floor. The cuff of the blood pressure monitor will be placed directly against the skin of your upper arm at the level of your heart. Blood pressure should be measured at least twice using the same arm. Certain conditions can cause a difference in blood pressure between your right and left arms. If you have a high blood pressure reading during one visit or you have normal blood pressure with other risk factors, you may be asked to: Return on a different day to have your blood pressure checked again. Monitor your blood pressure at home for 1 week or longer. If you are diagnosed with hypertension, you may have other blood or imaging tests to help your health care provider understand your overall risk for other conditions. How is this treated? This condition is treated by making healthy lifestyle changes, such as eating healthy foods, exercising more, and reducing your alcohol intake. You may be referred for counseling on a healthy diet and physical activity. Your health care provider may prescribe medicine if lifestyle changes are not enough to get your blood pressure under control and if: Your systolic blood pressure is above 130. Your diastolic blood pressure is above 80. Your personal target blood pressure may vary depending on your medical conditions, your age, and other factors. Follow  these instructions at home: Eating and drinking  Eat a diet that is high in fiber and potassium, and low in sodium, added sugar, and fat. An example of this eating plan is  called the DASH diet. DASH stands for Dietary Approaches to Stop Hypertension. To eat this way: Eat plenty of fresh fruits and vegetables. Try to fill one half of your plate at each meal with fruits and vegetables. Eat whole grains, such as whole-wheat pasta, brown rice, or whole-grain bread. Fill about one fourth of your plate with whole grains. Eat or drink low-fat dairy products, such as skim milk or low-fat yogurt. Avoid fatty cuts of meat, processed or cured meats, and poultry with skin. Fill about one fourth of your plate with lean proteins, such as fish, chicken without skin, beans, eggs, or tofu. Avoid pre-made and processed foods. These tend to be higher in sodium, added sugar, and fat. Reduce your daily sodium intake. Many people with hypertension should eat less than 1,500 mg of sodium a day. Do not drink alcohol if: Your health care provider tells you not to drink. You are pregnant, may be pregnant, or are planning to become pregnant. If you drink alcohol: Limit how much you have to: 0-1 drink a day for women. 0-2 drinks a day for men. Know how much alcohol is in your drink. In the [place] one drink equals one 12 oz bottle of beer (355 mL), one 5 oz glass of wine (148 mL), or one 1 oz glass of hard liquor (44 mL). Lifestyle  Work with your health care provider to maintain a healthy body weight or to lose weight. Ask what an ideal weight is for you. Get at least 30 minutes of exercise that causes your heart to beat faster (aerobic exercise) most days of the week. Activities may include walking, swimming, or biking. Include exercise to strengthen your muscles (resistance exercise), such as Pilates or lifting weights, as part of your weekly exercise routine. Try to do these types of exercises for 30 minutes at least 3 days a week. Do not use any products that contain nicotine or tobacco. These products include cigarettes, chewing tobacco, and vaping devices, such as e-cigarettes. If you  need help quitting, ask your health care provider. Monitor your blood pressure at home as told by your health care provider. Keep all follow-up visits. This is important. Medicines Take over-the-counter and prescription medicines only as told by your health care provider. Follow directions carefully. Blood pressure medicines must be taken as prescribed. Do not skip doses of blood pressure medicine. Doing this puts you at risk for problems and can make the medicine less effective. Ask your health care provider about side effects or reactions to medicines that you should watch for. Contact a health care provider if you: Think you are having a reaction to a medicine you are taking. Have headaches that keep coming back (recurring). Feel dizzy. Have swelling in your ankles. Have trouble with your vision. Get help right away if you: Develop a severe headache or confusion. Have unusual weakness or numbness. Feel faint. Have severe pain in your chest or abdomen. Vomit repeatedly. Have trouble breathing. These symptoms may be an emergency. Get help right away. Call 911. Do not wait to see if the symptoms will go away. Do not drive yourself to the hospital. Summary Hypertension is when the force of blood pumping through your arteries is too strong. If this condition is not controlled, it may put you at risk for  serious complications. Your personal target blood pressure may vary depending on your medical conditions, your age, and other factors. For most people, a normal blood pressure is less than 120/80. Hypertension is treated with lifestyle changes, medicines, or a combination of both. Lifestyle changes include losing weight, eating a healthy, low-sodium diet, exercising more, and limiting alcohol. This information is not intended to replace advice given to you by your health care provider. Make sure you discuss any questions you have with your health care provider. Document Revised: 01/14/2021  Document Reviewed: 01/14/2021 Elsevier Patient Education  2023 [employer].

## 2021-10-01 NOTE — Progress Notes (Signed)
Antonio Rodgers 41 y.o.   Chief Complaint  Patient presents with   Follow-up    Blood pressure concerns     HISTORY OF PRESENT ILLNESS: This is a 41 y.o. male with history hypertension on amlodipine and valsartan/HCTZ here for follow-up of recent emergency department visit when he presented with uncontrolled hypertension.  Was treated with clonidine.  Not taking clonidine at present time.  Asymptomatic today. Last office visit he was referred to cardiology for evaluation of hypertensive heart disease.  Has appointment for later this month. Has history of chronic allergies. No other complaints or medical concerns today.  HPI   Prior to Admission medications   Medication Sig Start Date End Date Taking? Authorizing Provider  amLODipine (NORVASC) 10 MG tablet TAKE 1 TABLET BY MOUTH EVERY DAY 10/01/20   Georgina Quint, MD  azelastine (OPTIVAR) 0.05 % ophthalmic solution Place 1 drop into both eyes 2 (two) times daily. 01/26/20   Shade Flood, MD  fluticasone (FLONASE) 50 MCG/ACT nasal spray Place 2 sprays into both nostrils daily. 01/26/20   Shade Flood, MD  rosuvastatin (CRESTOR) 20 MG tablet TAKE 1 TABLET BY MOUTH EVERY DAY 10/01/20   Georgina Quint, MD  valsartan-hydrochlorothiazide (DIOVAN-HCT) 160-12.5 MG tablet Take 1 tablet by mouth daily. 09/02/21   Georgina Quint, MD    Allergies  Allergen Reactions   Other Other (See Comments)    Family history of malignant hyperthermia under general anesthesia; pt himself has not undergone general anesthesia    Patient Active Problem List   Diagnosis Date Noted   Essential hypertension 11/18/2014    Past Medical History:  Diagnosis Date   Hypertension     Past Surgical History:  Procedure Laterality Date   KNEE SURGERY      Social History   Socioeconomic History   Marital status: Single    Spouse name: Not on file   Number of children: Not on file   Years of education: Not on file   Highest  education level: Not on file  Occupational History   Not on file  Tobacco Use   Smoking status: Never   Smokeless tobacco: Never  Vaping Use   Vaping Use: Never used  Substance and Sexual Activity   Alcohol use: Yes    Alcohol/week: 0.0 standard drinks of alcohol    Comment: Weekends   Drug use: No   Sexual activity: Not on file  Other Topics Concern   Not on file  Social History Narrative   Not on file   Social Determinants of Health   Financial Resource Strain: Not on file  Food Insecurity: Not on file  Transportation Needs: Not on file  Physical Activity: Not on file  Stress: Not on file  Social Connections: Not on file  Intimate Partner Violence: Not on file    History reviewed. No pertinent family history.   Review of Systems  Constitutional: Negative.  Negative for chills and fever.  HENT: Negative.  Negative for congestion and sore throat.   Eyes: Negative.   Respiratory: Negative.  Negative for cough and shortness of breath.   Cardiovascular: Negative.  Negative for chest pain and palpitations.  Gastrointestinal:  Negative for abdominal pain, diarrhea, nausea and vomiting.  Genitourinary: Negative.   Musculoskeletal: Negative.   Skin: Negative.  Negative for rash.  Neurological:  Positive for headaches. Negative for dizziness.  All other systems reviewed and are negative.  Today's Vitals   10/01/21 0831  BP: (!) 180/100  Pulse: 73  Temp: 98.1 F (36.7 C)  TempSrc: Oral  SpO2: 97%  Weight: 287 lb 6 oz (130.4 kg)  Height: 5\' 9"  (1.753 m)   Body mass index is 42.44 kg/m. Wt Readings from Last 3 Encounters:  10/01/21 287 lb 6 oz (130.4 kg)  09/02/21 293 lb 4 oz (133 kg)  08/28/21 285 lb (129.3 kg)     Physical Exam Vitals reviewed.  Constitutional:      Appearance: Normal appearance. He is obese.  HENT:     Head: Normocephalic.  Eyes:     Extraocular Movements: Extraocular movements intact.     Pupils: Pupils are equal, round, and reactive  to light.  Cardiovascular:     Rate and Rhythm: Normal rate and regular rhythm.     Pulses: Normal pulses.     Heart sounds: Normal heart sounds.  Pulmonary:     Effort: Pulmonary effort is normal.     Breath sounds: Normal breath sounds.  Musculoskeletal:        General: Normal range of motion.     Cervical back: No tenderness.  Lymphadenopathy:     Cervical: No cervical adenopathy.  Skin:    General: Skin is warm and dry.  Neurological:     General: No focal deficit present.     Mental Status: He is alert and oriented to person, place, and time.  Psychiatric:        Mood and Affect: Mood normal.        Behavior: Behavior normal.      ASSESSMENT & PLAN: A total of 45 minutes was spent with the patient and counseling/coordination of care regarding preparing for this visit, review of most recent office visit note, review of most recent emergency department visit notes, review of all medications and changes made, cardiovascular risk associated with uncontrolled hypertension, education on nutrition and need to lose weight and exercise more, need for cardiology evaluation and assessment for hypertensive heart disease, prognosis, documentation and need for follow-up.  Problem List Items Addressed This Visit       Cardiovascular and Mediastinum   Essential hypertension - Primary    Uncontrolled hypertension.  Continue amlodipine 10 mg and increase dose of valsartan-hydrochlorothiazide to 320-12.5 mg. Blood work done today.  Has had hypokalemia in the past. Dietary approaches to stop hypertension discussed. Cardiovascular risk associated with uncontrolled hypertension discussed. Diet and nutrition discussed.  Encouraged to lose some weight and exercise more. Follow-up in 4 weeks after cardiology evaluation.      Relevant Medications   cloNIDine (CATAPRES) 0.2 MG tablet   valsartan-hydrochlorothiazide (DIOVAN-HCT) 320-12.5 MG tablet   Other Visit Diagnoses     Uncontrolled  hypertension       Relevant Medications   cloNIDine (CATAPRES) 0.2 MG tablet   valsartan-hydrochlorothiazide (DIOVAN-HCT) 320-12.5 MG tablet   Other Relevant Orders   Aldosterone + renin activity w/ ratio   Comprehensive metabolic panel   Lipid panel      Patient Instructions  Continue amlodipine 10 mg daily. Increase dose of valsartan-HCTZ to 320-12.5 mg.  New prescription sent to pharmacy of record Monitor blood pressure readings at home daily for several weeks and keep a log.  Contact the office if numbers persistently high. Follow-up in 4 weeks after cardiology evaluation.  Hypertension, Adult High blood pressure (hypertension) is when the force of blood pumping through the arteries is too strong. The arteries are the blood vessels that carry blood from the heart throughout the body. Hypertension forces the  heart to work harder to pump blood and may cause arteries to become narrow or stiff. Untreated or uncontrolled hypertension can lead to a heart attack, heart failure, a stroke, kidney disease, and other problems. A blood pressure reading consists of a higher number over a lower number. Ideally, your blood pressure should be below 120/80. The first ("top") number is called the systolic pressure. It is a measure of the pressure in your arteries as your heart beats. The second ("bottom") number is called the diastolic pressure. It is a measure of the pressure in your arteries as the heart relaxes. What are the causes? The exact cause of this condition is not known. There are some conditions that result in high blood pressure. What increases the risk? Certain factors may make you more likely to develop high blood pressure. Some of these risk factors are under your control, including: Smoking. Not getting enough exercise or physical activity. Being overweight. Having too much fat, sugar, calories, or salt (sodium) in your diet. Drinking too much alcohol. Other risk factors  include: Having a personal history of heart disease, diabetes, high cholesterol, or kidney disease. Stress. Having a family history of high blood pressure and high cholesterol. Having obstructive sleep apnea. Age. The risk increases with age. What are the signs or symptoms? High blood pressure may not cause symptoms. Very high blood pressure (hypertensive crisis) may cause: Headache. Fast or irregular heartbeats (palpitations). Shortness of breath. Nosebleed. Nausea and vomiting. Vision changes. Severe chest pain, dizziness, and seizures. How is this diagnosed? This condition is diagnosed by measuring your blood pressure while you are seated, with your arm resting on a flat surface, your legs uncrossed, and your feet flat on the floor. The cuff of the blood pressure monitor will be placed directly against the skin of your upper arm at the level of your heart. Blood pressure should be measured at least twice using the same arm. Certain conditions can cause a difference in blood pressure between your right and left arms. If you have a high blood pressure reading during one visit or you have normal blood pressure with other risk factors, you may be asked to: Return on a different day to have your blood pressure checked again. Monitor your blood pressure at home for 1 week or longer. If you are diagnosed with hypertension, you may have other blood or imaging tests to help your health care provider understand your overall risk for other conditions. How is this treated? This condition is treated by making healthy lifestyle changes, such as eating healthy foods, exercising more, and reducing your alcohol intake. You may be referred for counseling on a healthy diet and physical activity. Your health care provider may prescribe medicine if lifestyle changes are not enough to get your blood pressure under control and if: Your systolic blood pressure is above 130. Your diastolic blood pressure is above  80. Your personal target blood pressure may vary depending on your medical conditions, your age, and other factors. Follow these instructions at home: Eating and drinking  Eat a diet that is high in fiber and potassium, and low in sodium, added sugar, and fat. An example of this eating plan is called the DASH diet. DASH stands for Dietary Approaches to Stop Hypertension. To eat this way: Eat plenty of fresh fruits and vegetables. Try to fill one half of your plate at each meal with fruits and vegetables. Eat whole grains, such as whole-wheat pasta, brown rice, or whole-grain bread. Fill about one  fourth of your plate with whole grains. Eat or drink low-fat dairy products, such as skim milk or low-fat yogurt. Avoid fatty cuts of meat, processed or cured meats, and poultry with skin. Fill about one fourth of your plate with lean proteins, such as fish, chicken without skin, beans, eggs, or tofu. Avoid pre-made and processed foods. These tend to be higher in sodium, added sugar, and fat. Reduce your daily sodium intake. Many people with hypertension should eat less than 1,500 mg of sodium a day. Do not drink alcohol if: Your health care provider tells you not to drink. You are pregnant, may be pregnant, or are planning to become pregnant. If you drink alcohol: Limit how much you have to: 0-1 drink a day for women. 0-2 drinks a day for men. Know how much alcohol is in your drink. In the [place] one drink equals one 12 oz bottle of beer (355 mL), one 5 oz glass of wine (148 mL), or one 1 oz glass of hard liquor (44 mL). Lifestyle  Work with your health care provider to maintain a healthy body weight or to lose weight. Ask what an ideal weight is for you. Get at least 30 minutes of exercise that causes your heart to beat faster (aerobic exercise) most days of the week. Activities may include walking, swimming, or biking. Include exercise to strengthen your muscles (resistance exercise), such as  Pilates or lifting weights, as part of your weekly exercise routine. Try to do these types of exercises for 30 minutes at least 3 days a week. Do not use any products that contain nicotine or tobacco. These products include cigarettes, chewing tobacco, and vaping devices, such as e-cigarettes. If you need help quitting, ask your health care provider. Monitor your blood pressure at home as told by your health care provider. Keep all follow-up visits. This is important. Medicines Take over-the-counter and prescription medicines only as told by your health care provider. Follow directions carefully. Blood pressure medicines must be taken as prescribed. Do not skip doses of blood pressure medicine. Doing this puts you at risk for problems and can make the medicine less effective. Ask your health care provider about side effects or reactions to medicines that you should watch for. Contact a health care provider if you: Think you are having a reaction to a medicine you are taking. Have headaches that keep coming back (recurring). Feel dizzy. Have swelling in your ankles. Have trouble with your vision. Get help right away if you: Develop a severe headache or confusion. Have unusual weakness or numbness. Feel faint. Have severe pain in your chest or abdomen. Vomit repeatedly. Have trouble breathing. These symptoms may be an emergency. Get help right away. Call 911. Do not wait to see if the symptoms will go away. Do not drive yourself to the hospital. Summary Hypertension is when the force of blood pumping through your arteries is too strong. If this condition is not controlled, it may put you at risk for serious complications. Your personal target blood pressure may vary depending on your medical conditions, your age, and other factors. For most people, a normal blood pressure is less than 120/80. Hypertension is treated with lifestyle changes, medicines, or a combination of both. Lifestyle  changes include losing weight, eating a healthy, low-sodium diet, exercising more, and limiting alcohol. This information is not intended to replace advice given to you by your health care provider. Make sure you discuss any questions you have with your health care provider.  Document Revised: 01/14/2021 Document Reviewed: 01/14/2021 Elsevier Patient Education  2023 [employer]      Edwina BarthMiguel Lidiya Reise, MD [facility]

## 2021-10-01 NOTE — Assessment & Plan Note (Signed)
Uncontrolled hypertension.  Continue amlodipine 10 mg and increase dose of valsartan-hydrochlorothiazide to 320-12.5 mg. Blood work done today.  Has had hypokalemia in the past. Dietary approaches to stop hypertension discussed. Cardiovascular risk associated with uncontrolled hypertension discussed. Diet and nutrition discussed.  Encouraged to lose some weight and exercise more. Follow-up in 4 weeks after cardiology evaluation.

## 2021-10-10 LAB — ALDOSTERONE + RENIN ACTIVITY W/ RATIO
ALDO / PRA Ratio: 11.1 Ratio (ref 0.9–28.9)
Aldosterone: 9 ng/dL
Renin Activity: 0.81 ng/mL/h (ref 0.25–5.82)

## 2021-10-14 ENCOUNTER — Ambulatory Visit: Payer: BLUE CROSS/BLUE SHIELD | Admitting: Cardiovascular Disease

## 2021-10-21 ENCOUNTER — Ambulatory Visit (INDEPENDENT_AMBULATORY_CARE_PROVIDER_SITE_OTHER): Payer: BLUE CROSS/BLUE SHIELD | Admitting: Emergency Medicine

## 2021-10-21 ENCOUNTER — Other Ambulatory Visit: Payer: Self-pay | Admitting: Emergency Medicine

## 2021-10-21 ENCOUNTER — Other Ambulatory Visit: Payer: Self-pay | Admitting: Family Medicine

## 2021-10-21 ENCOUNTER — Encounter: Payer: Self-pay | Admitting: Emergency Medicine

## 2021-10-21 DIAGNOSIS — H1013 Acute atopic conjunctivitis, bilateral: Secondary | ICD-10-CM

## 2021-10-21 DIAGNOSIS — I10 Essential (primary) hypertension: Secondary | ICD-10-CM

## 2021-10-21 DIAGNOSIS — J309 Allergic rhinitis, unspecified: Secondary | ICD-10-CM | POA: Diagnosis not present

## 2021-10-21 MED ORDER — FLUTICASONE PROPIONATE 50 MCG/ACT NA SUSP: 2.0000 | Freq: Every day | NASAL | 6 refills | Status: DC

## 2021-10-21 MED ORDER — AZELASTINE HCL 0.05 % OP SOLN: 1.0000 [drp] | Freq: Two times a day (BID) | OPHTHALMIC | 3 refills | Status: DC

## 2021-10-21 NOTE — Assessment & Plan Note (Signed)
Elevated blood pressure reading in the office and at home. Trending down however. Continue amlodipine 10 mg daily and valsartan-HCTZ 320-12.5 mg daily. Cardiovascular risks associated with hypertension discussed. Dietary approaches to stop hypertension discussed. Advised to continue monitoring blood pressure readings at home daily for the next several weeks. Has cardiology appointment on August 22. We will follow-up after that.

## 2021-10-21 NOTE — Patient Instructions (Signed)
Hypertension, Adult High blood pressure (hypertension) is when the force of blood pumping through the arteries is too strong. The arteries are the blood vessels that carry blood from the heart throughout the body. Hypertension forces the heart to work harder to pump blood and may cause arteries to become narrow or stiff. Untreated or uncontrolled hypertension can lead to a heart attack, heart failure, a stroke, kidney disease, and other problems. A blood pressure reading consists of a higher number over a lower number. Ideally, your blood pressure should be below 120/80. The first ("top") number is called the systolic pressure. It is a measure of the pressure in your arteries as your heart beats. The second ("bottom") number is called the diastolic pressure. It is a measure of the pressure in your arteries as the heart relaxes. What are the causes? The exact cause of this condition is not known. There are some conditions that result in high blood pressure. What increases the risk? Certain factors may make you more likely to develop high blood pressure. Some of these risk factors are under your control, including: Smoking. Not getting enough exercise or physical activity. Being overweight. Having too much fat, sugar, calories, or salt (sodium) in your diet. Drinking too much alcohol. Other risk factors include: Having a personal history of heart disease, diabetes, high cholesterol, or kidney disease. Stress. Having a family history of high blood pressure and high cholesterol. Having obstructive sleep apnea. Age. The risk increases with age. What are the signs or symptoms? High blood pressure may not cause symptoms. Very high blood pressure (hypertensive crisis) may cause: Headache. Fast or irregular heartbeats (palpitations). Shortness of breath. Nosebleed. Nausea and vomiting. Vision changes. Severe chest pain, dizziness, and seizures. How is this diagnosed? This condition is diagnosed by  measuring your blood pressure while you are seated, with your arm resting on a flat surface, your legs uncrossed, and your feet flat on the floor. The cuff of the blood pressure monitor will be placed directly against the skin of your upper arm at the level of your heart. Blood pressure should be measured at least twice using the same arm. Certain conditions can cause a difference in blood pressure between your right and left arms. If you have a high blood pressure reading during one visit or you have normal blood pressure with other risk factors, you may be asked to: Return on a different day to have your blood pressure checked again. Monitor your blood pressure at home for 1 week or longer. If you are diagnosed with hypertension, you may have other blood or imaging tests to help your health care provider understand your overall risk for other conditions. How is this treated? This condition is treated by making healthy lifestyle changes, such as eating healthy foods, exercising more, and reducing your alcohol intake. You may be referred for counseling on a healthy diet and physical activity. Your health care provider may prescribe medicine if lifestyle changes are not enough to get your blood pressure under control and if: Your systolic blood pressure is above 130. Your diastolic blood pressure is above 80. Your personal target blood pressure may vary depending on your medical conditions, your age, and other factors. Follow these instructions at home: Eating and drinking  Eat a diet that is high in fiber and potassium, and low in sodium, added sugar, and fat. An example of this eating plan is called the DASH diet. DASH stands for Dietary Approaches to Stop Hypertension. To eat this way: Eat   plenty of fresh fruits and vegetables. Try to fill one half of your plate at each meal with fruits and vegetables. Eat whole grains, such as whole-wheat pasta, brown rice, or whole-grain bread. Fill about one  fourth of your plate with whole grains. Eat or drink low-fat dairy products, such as skim milk or low-fat yogurt. Avoid fatty cuts of meat, processed or cured meats, and poultry with skin. Fill about one fourth of your plate with lean proteins, such as fish, chicken without skin, beans, eggs, or tofu. Avoid pre-made and processed foods. These tend to be higher in sodium, added sugar, and fat. Reduce your daily sodium intake. Many people with hypertension should eat less than 1,500 mg of sodium a day. Do not drink alcohol if: Your health care provider tells you not to drink. You are pregnant, may be pregnant, or are planning to become pregnant. If you drink alcohol: Limit how much you have to: 0-1 drink a day for women. 0-2 drinks a day for men. Know how much alcohol is in your drink. In the [place] one drink equals one 12 oz bottle of beer (355 mL), one 5 oz glass of wine (148 mL), or one 1 oz glass of hard liquor (44 mL). Lifestyle  Work with your health care provider to maintain a healthy body weight or to lose weight. Ask what an ideal weight is for you. Get at least 30 minutes of exercise that causes your heart to beat faster (aerobic exercise) most days of the week. Activities may include walking, swimming, or biking. Include exercise to strengthen your muscles (resistance exercise), such as Pilates or lifting weights, as part of your weekly exercise routine. Try to do these types of exercises for 30 minutes at least 3 days a week. Do not use any products that contain nicotine or tobacco. These products include cigarettes, chewing tobacco, and vaping devices, such as e-cigarettes. If you need help quitting, ask your health care provider. Monitor your blood pressure at home as told by your health care provider. Keep all follow-up visits. This is important. Medicines Take over-the-counter and prescription medicines only as told by your health care provider. Follow directions carefully. Blood  pressure medicines must be taken as prescribed. Do not skip doses of blood pressure medicine. Doing this puts you at risk for problems and can make the medicine less effective. Ask your health care provider about side effects or reactions to medicines that you should watch for. Contact a health care provider if you: Think you are having a reaction to a medicine you are taking. Have headaches that keep coming back (recurring). Feel dizzy. Have swelling in your ankles. Have trouble with your vision. Get help right away if you: Develop a severe headache or confusion. Have unusual weakness or numbness. Feel faint. Have severe pain in your chest or abdomen. Vomit repeatedly. Have trouble breathing. These symptoms may be an emergency. Get help right away. Call 911. Do not wait to see if the symptoms will go away. Do not drive yourself to the hospital. Summary Hypertension is when the force of blood pumping through your arteries is too strong. If this condition is not controlled, it may put you at risk for serious complications. Your personal target blood pressure may vary depending on your medical conditions, your age, and other factors. For most people, a normal blood pressure is less than 120/80. Hypertension is treated with lifestyle changes, medicines, or a combination of both. Lifestyle changes include losing weight, eating a healthy,   low-sodium diet, exercising more, and limiting alcohol. This information is not intended to replace advice given to you by your health care provider. Make sure you discuss any questions you have with your health care provider. Document Revised: 01/14/2021 Document Reviewed: 01/14/2021 Elsevier Patient Education  2023 [employer]  

## 2021-10-21 NOTE — Progress Notes (Signed)
BP Readings from Last 3 Encounters:  10/21/21 (!) 166/104  10/01/21 (!) 178/100  09/02/21 (!) 200/120   Antonio Rodgers 41 y.o.   Chief Complaint  Patient presents with   Hypertension    Still concerns about BP     HISTORY OF PRESENT ILLNESS: This is a 41 y.o. male here for follow-up of hypertension. Not taking medications as prescribed.  Did not take it this morning. Asymptomatic.  HPI   Prior to Admission medications   Medication Sig Start Date End Date Taking? Authorizing Provider  amLODipine (NORVASC) 10 MG tablet TAKE 1 TABLET BY MOUTH EVERY DAY 10/01/20  Yes Kinslie Hove, Eilleen Kempf, MD  cloNIDine (CATAPRES) 0.2 MG tablet Take 0.2 mg by mouth 3 (three) times daily. 09/15/21  Yes [provider]  fluticasone (FLONASE) 50 MCG/ACT nasal spray Place 2 sprays into both nostrils daily. 01/26/20  Yes Shade Flood, MD  rosuvastatin (CRESTOR) 20 MG tablet TAKE 1 TABLET BY MOUTH EVERY DAY 10/01/20  Yes Jerett Odonohue, Eilleen Kempf, MD  valsartan-hydrochlorothiazide (DIOVAN-HCT) 320-12.5 MG tablet Take 1 tablet by mouth daily. 10/01/21  Yes Jolanda Mccann, Eilleen Kempf, MD  azelastine (OPTIVAR) 0.05 % ophthalmic solution Place 1 drop into both eyes 2 (two) times daily. Patient not taking: Reported on 10/21/2021 01/26/20   Shade Flood, MD    Allergies  Allergen Reactions   Other Other (See Comments)    Family history of malignant hyperthermia under general anesthesia; pt himself has not undergone general anesthesia    Patient Active Problem List   Diagnosis Date Noted   Essential hypertension 11/18/2014    Past Medical History:  Diagnosis Date   Hypertension     Past Surgical History:  Procedure Laterality Date   KNEE SURGERY      Social History   Socioeconomic History   Marital status: Single    Spouse name: Not on file   Number of children: Not on file   Years of education: Not on file   Highest education level: Not on file  Occupational History   Not on file   Tobacco Use   Smoking status: Never   Smokeless tobacco: Never  Vaping Use   Vaping Use: Never used  Substance and Sexual Activity   Alcohol use: Yes    Alcohol/week: 0.0 standard drinks of alcohol    Comment: Weekends   Drug use: No   Sexual activity: Not on file  Other Topics Concern   Not on file  Social History Narrative   Not on file   Social Determinants of Health   Financial Resource Strain: Not on file  Food Insecurity: Not on file  Transportation Needs: Not on file  Physical Activity: Not on file  Stress: Not on file  Social Connections: Not on file  Intimate Partner Violence: Not on file    No family history on file.   Review of Systems  Constitutional: Negative.  Negative for chills and fever.  HENT: Negative.  Negative for congestion and sore throat.   Respiratory: Negative.  Negative for cough and shortness of breath.   Cardiovascular: Negative.  Negative for chest pain and palpitations.  Gastrointestinal: Negative.  Negative for abdominal pain, nausea and vomiting.  Genitourinary: Negative.  Negative for hematuria.  Skin: Negative.  Negative for rash.  Neurological: Negative.  Negative for dizziness and headaches.  All other systems reviewed and are negative.  Today's Vitals   10/21/21 0858 10/21/21 0902  BP: (!) 162/100 (!) 166/104  Pulse: 71   Temp:  98 F (36.7 C)   TempSrc: Oral   SpO2: 98%   Weight: 278 lb 8 oz (126.3 kg)   Height: 5\' 9"  (1.753 m)    Body mass index is 41.13 kg/m.   Physical Exam Vitals reviewed.  Constitutional:      Appearance: Normal appearance.  HENT:     Head: Normocephalic.  Eyes:     Extraocular Movements: Extraocular movements intact.     Conjunctiva/sclera: Conjunctivae normal.     Pupils: Pupils are equal, round, and reactive to light.  Cardiovascular:     Rate and Rhythm: Normal rate and regular rhythm.     Pulses: Normal pulses.     Heart sounds: Normal heart sounds.  Pulmonary:     Effort:  Pulmonary effort is normal.     Breath sounds: Normal breath sounds.  Abdominal:     Palpations: Abdomen is soft.     Tenderness: There is no abdominal tenderness.  Musculoskeletal:     Cervical back: No tenderness.     Right lower leg: No edema.     Left lower leg: No edema.  Lymphadenopathy:     Cervical: No cervical adenopathy.  Skin:    General: Skin is warm and dry.     Capillary Refill: Capillary refill takes less than 2 seconds.  Neurological:     General: No focal deficit present.     Mental Status: He is alert and oriented to person, place, and time.  Psychiatric:        Mood and Affect: Mood normal.        Behavior: Behavior normal.      ASSESSMENT & PLAN: A total of 47 minutes was spent with the patient and counseling/coordination of care regarding preparing for this visit, review of most recent office visit notes, cardiovascular risks associated with uncontrolled hypertension, review of all medications and changes made, education on nutrition and dietary approaches to stop hypertension, review of most recent blood work results, prognosis, documentation and need for follow-up.  Problem List Items Addressed This Visit       Cardiovascular and Mediastinum   Essential hypertension    Elevated blood pressure reading in the office and at home. Trending down however. Continue amlodipine 10 mg daily and valsartan-HCTZ 320-12.5 mg daily. Cardiovascular risks associated with hypertension discussed. Dietary approaches to stop hypertension discussed. Advised to continue monitoring blood pressure readings at home daily for the next several weeks. Has cardiology appointment on August 22. We will follow-up after that.      Other Visit Diagnoses     Allergic conjunctivitis of both eyes       Relevant Medications   azelastine (OPTIVAR) 0.05 % ophthalmic solution   Allergic rhinitis, unspecified seasonality, unspecified trigger       Relevant Medications   fluticasone  (FLONASE) 50 MCG/ACT nasal spray      Patient Instructions  Hypertension, Adult High blood pressure (hypertension) is when the force of blood pumping through the arteries is too strong. The arteries are the blood vessels that carry blood from the heart throughout the body. Hypertension forces the heart to work harder to pump blood and may cause arteries to become narrow or stiff. Untreated or uncontrolled hypertension can lead to a heart attack, heart failure, a stroke, kidney disease, and other problems. A blood pressure reading consists of a higher number over a lower number. Ideally, your blood pressure should be below 120/80. The first ("top") number is called the systolic pressure. It is a  measure of the pressure in your arteries as your heart beats. The second ("bottom") number is called the diastolic pressure. It is a measure of the pressure in your arteries as the heart relaxes. What are the causes? The exact cause of this condition is not known. There are some conditions that result in high blood pressure. What increases the risk? Certain factors may make you more likely to develop high blood pressure. Some of these risk factors are under your control, including: Smoking. Not getting enough exercise or physical activity. Being overweight. Having too much fat, sugar, calories, or salt (sodium) in your diet. Drinking too much alcohol. Other risk factors include: Having a personal history of heart disease, diabetes, high cholesterol, or kidney disease. Stress. Having a family history of high blood pressure and high cholesterol. Having obstructive sleep apnea. Age. The risk increases with age. What are the signs or symptoms? High blood pressure may not cause symptoms. Very high blood pressure (hypertensive crisis) may cause: Headache. Fast or irregular heartbeats (palpitations). Shortness of breath. Nosebleed. Nausea and vomiting. Vision changes. Severe chest pain, dizziness, and  seizures. How is this diagnosed? This condition is diagnosed by measuring your blood pressure while you are seated, with your arm resting on a flat surface, your legs uncrossed, and your feet flat on the floor. The cuff of the blood pressure monitor will be placed directly against the skin of your upper arm at the level of your heart. Blood pressure should be measured at least twice using the same arm. Certain conditions can cause a difference in blood pressure between your right and left arms. If you have a high blood pressure reading during one visit or you have normal blood pressure with other risk factors, you may be asked to: Return on a different day to have your blood pressure checked again. Monitor your blood pressure at home for 1 week or longer. If you are diagnosed with hypertension, you may have other blood or imaging tests to help your health care provider understand your overall risk for other conditions. How is this treated? This condition is treated by making healthy lifestyle changes, such as eating healthy foods, exercising more, and reducing your alcohol intake. You may be referred for counseling on a healthy diet and physical activity. Your health care provider may prescribe medicine if lifestyle changes are not enough to get your blood pressure under control and if: Your systolic blood pressure is above 130. Your diastolic blood pressure is above 80. Your personal target blood pressure may vary depending on your medical conditions, your age, and other factors. Follow these instructions at home: Eating and drinking  Eat a diet that is high in fiber and potassium, and low in sodium, added sugar, and fat. An example of this eating plan is called the DASH diet. DASH stands for Dietary Approaches to Stop Hypertension. To eat this way: Eat plenty of fresh fruits and vegetables. Try to fill one half of your plate at each meal with fruits and vegetables. Eat whole grains, such as  whole-wheat pasta, brown rice, or whole-grain bread. Fill about one fourth of your plate with whole grains. Eat or drink low-fat dairy products, such as skim milk or low-fat yogurt. Avoid fatty cuts of meat, processed or cured meats, and poultry with skin. Fill about one fourth of your plate with lean proteins, such as fish, chicken without skin, beans, eggs, or tofu. Avoid pre-made and processed foods. These tend to be higher in sodium, added sugar, and  fat. Reduce your daily sodium intake. Many people with hypertension should eat less than 1,500 mg of sodium a day. Do not drink alcohol if: Your health care provider tells you not to drink. You are pregnant, may be pregnant, or are planning to become pregnant. If you drink alcohol: Limit how much you have to: 0-1 drink a day for women. 0-2 drinks a day for men. Know how much alcohol is in your drink. In the [place] one drink equals one 12 oz bottle of beer (355 mL), one 5 oz glass of wine (148 mL), or one 1 oz glass of hard liquor (44 mL). Lifestyle  Work with your health care provider to maintain a healthy body weight or to lose weight. Ask what an ideal weight is for you. Get at least 30 minutes of exercise that causes your heart to beat faster (aerobic exercise) most days of the week. Activities may include walking, swimming, or biking. Include exercise to strengthen your muscles (resistance exercise), such as Pilates or lifting weights, as part of your weekly exercise routine. Try to do these types of exercises for 30 minutes at least 3 days a week. Do not use any products that contain nicotine or tobacco. These products include cigarettes, chewing tobacco, and vaping devices, such as e-cigarettes. If you need help quitting, ask your health care provider. Monitor your blood pressure at home as told by your health care provider. Keep all follow-up visits. This is important. Medicines Take over-the-counter and prescription medicines only as  told by your health care provider. Follow directions carefully. Blood pressure medicines must be taken as prescribed. Do not skip doses of blood pressure medicine. Doing this puts you at risk for problems and can make the medicine less effective. Ask your health care provider about side effects or reactions to medicines that you should watch for. Contact a health care provider if you: Think you are having a reaction to a medicine you are taking. Have headaches that keep coming back (recurring). Feel dizzy. Have swelling in your ankles. Have trouble with your vision. Get help right away if you: Develop a severe headache or confusion. Have unusual weakness or numbness. Feel faint. Have severe pain in your chest or abdomen. Vomit repeatedly. Have trouble breathing. These symptoms may be an emergency. Get help right away. Call 911. Do not wait to see if the symptoms will go away. Do not drive yourself to the hospital. Summary Hypertension is when the force of blood pumping through your arteries is too strong. If this condition is not controlled, it may put you at risk for serious complications. Your personal target blood pressure may vary depending on your medical conditions, your age, and other factors. For most people, a normal blood pressure is less than 120/80. Hypertension is treated with lifestyle changes, medicines, or a combination of both. Lifestyle changes include losing weight, eating a healthy, low-sodium diet, exercising more, and limiting alcohol. This information is not intended to replace advice given to you by your health care provider. Make sure you discuss any questions you have with your health care provider. Document Revised: 01/14/2021 Document Reviewed: 01/14/2021 Elsevier Patient Education  2023 [employer].     Edwina Barth, MD [facility]

## 2021-11-11 ENCOUNTER — Ambulatory Visit (INDEPENDENT_AMBULATORY_CARE_PROVIDER_SITE_OTHER): Payer: BLUE CROSS/BLUE SHIELD | Admitting: Cardiovascular Disease

## 2021-11-11 ENCOUNTER — Encounter: Payer: Self-pay | Admitting: Cardiovascular Disease

## 2021-11-11 VITALS — BP 162/108 | HR 63 | Ht 69.0 in | Wt 278.4 lb

## 2021-11-11 DIAGNOSIS — I150 Renovascular hypertension: Secondary | ICD-10-CM

## 2021-11-11 DIAGNOSIS — R0681 Apnea, not elsewhere classified: Secondary | ICD-10-CM | POA: Diagnosis not present

## 2021-11-11 MED ORDER — NEBIVOLOL HCL 5 MG PO TABS: 5.0000 mg | ORAL_TABLET | Freq: Every day | ORAL | 2 refills | Status: DC

## 2021-11-11 NOTE — Patient Instructions (Signed)
Medication Instructions:  START Bystolic 5 mg once daily  *If you need a refill on your cardiac medications before your next appointment, please call your pharmacy*   Lab Work: None ordered If you have labs (blood work) drawn today and your tests are completely normal, you will receive your results only by: MyChart Message (if you have MyChart) OR A paper copy in the mail If you have any lab test that is abnormal or we need to change your treatment, we will call you to review the results.   Testing/Procedures: Your physician has requested that you have a renal artery duplex. During this test, an ultrasound is used to evaluate blood flow to the kidneys. Take your medications as you usually do. This will take place at [address] [facility], [address].  No food after 11PM the night before.  Water is OK. (Don't drink liquids if you have been instructed not to for ANOTHER test). Avoid foods that produce bowel gas, for 24 hours prior to exam (see below). No breakfast, no chewing gum, no smoking or carbonated beverages. Patient may take morning medications with water. Come in for test at least 15 minutes early to register.  Your physician has recommended that you have a sleep study. This test records several body functions during sleep, including: brain activity, eye movement, oxygen and carbon dioxide blood levels, heart rate and rhythm, breathing rate and rhythm, the flow of air through your mouth and nose, snoring, body muscle movements, and chest and belly movement. They will call you to set this up   Follow-Up: At [facility], you and your health needs are our priority.  As part of our continuing mission to provide you with exceptional heart care, we have created designated Provider Care Teams.  These Care Teams include your primary Cardiologist (physician) and Advanced Practice Providers (APPs -  Physician Assistants and Nurse Practitioners) who all work together to provide you with the  care you need, when you need it.  We recommend signing up for the patient portal called "MyChart".  Sign up information is provided on this After Visit Summary.  MyChart is used to connect with patients for Virtual Visits (Telemedicine).  Patients are able to view lab/test results, encounter notes, upcoming appointments, etc.  Non-urgent messages can be sent to your provider as well.   To learn more about what you can do with MyChart, go to [email or web address].    Your next appointment:   1 month(s)  The format for your next appointment:   In Person  Provider:   Thurmon Fair, MD or app   Other Instructions  [facility]:

## 2021-11-13 ENCOUNTER — Encounter: Payer: Self-pay | Admitting: Cardiovascular Disease

## 2021-11-13 NOTE — Progress Notes (Signed)
Cardiology Office Note:    Date:  11/13/2021   ID:  Antonio Rodgers, DOB 08-15-1980, MRN [id number]  PCP:  Georgina Quint, MD   [facility] Providers Cardiologist:  Thurmon Fair, MD   Referring MD: Georgina Quint, *   Chief Complaint  Patient presents with   Consult  Antonio Rodgers is a 41 y.o. male who is being seen today for the evaluation of HTN at the request of Georgina Quint, *.     History of Present Illness:    Antonio Rodgers is a 41 y.o. male with a hx of relatively recent onset of severe hypertension referred in consultation due to difficulty controlling his blood pressure.  He also has hypercholesterolemia, on statin therapy.  He is not often aware of his blood pressure, but he becomes "woozy" when his systolic blood pressure reaches about 190.  This happened in June, when he thinks was related to briefly overindulging in alcohol on his wedding anniversary.  He believes that throughout last year his average systolic blood pressure was about 180.  He has not had any alcoholic drinks in the last 2 months.  He is trying to eat a healthier diet, but when he describes this, it sounds like he is trying to avoid fatty or fried foods, without pay much attention to sodium content.  He denies angina at rest or with activity, shortness of breath at rest or with activity, orthopnea, PND, lower extremity edema, palpitations, syncope, focal neurological complaints, claudication.  He is a loud snorer, sometimes wakes himself up snoring, but he does not have severe daytime hypersomnolence.  He can finish a movie without falling asleep.  He rarely takes daytime naps.  He believes he was first diagnosed with high blood pressure about 5 years ago.  Both his parents have hypertension.  His grandmother required dialysis.  He thinks his mother had a stroke.  Past Medical History:  Diagnosis Date   Hypertension     Past Surgical History:  Procedure Laterality Date   KNEE  SURGERY      Current Medications: Current Meds  Medication Sig   amLODipine (NORVASC) 10 MG tablet TAKE 1 TABLET BY MOUTH EVERY DAY   nebivolol (BYSTOLIC) 5 MG tablet Take 1 tablet (5 mg total) by mouth daily.   rosuvastatin (CRESTOR) 20 MG tablet TAKE 1 TABLET BY MOUTH EVERY DAY   valsartan-hydrochlorothiazide (DIOVAN-HCT) 320-12.5 MG tablet Take 1 tablet by mouth daily.   [DISCONTINUED] losartan (COZAAR) 50 MG tablet TAKE 1 TABLET BY MOUTH EVERY DAY     Allergies:   Other   Social History   Socioeconomic History   Marital status: Single    Spouse name: Not on file   Number of children: Not on file   Years of education: Not on file   Highest education level: Not on file  Occupational History   Not on file  Tobacco Use   Smoking status: Never   Smokeless tobacco: Never  Vaping Use   Vaping Use: Never used  Substance and Sexual Activity   Alcohol use: Yes    Alcohol/week: 0.0 standard drinks of alcohol    Comment: Weekends   Drug use: No   Sexual activity: Not on file  Other Topics Concern   Not on file  Social History Narrative   Not on file   Social Determinants of Health   Financial Resource Strain: Not on file  Food Insecurity: Not on file  Transportation Needs: Not on file  Physical Activity: Not on file  Stress: Not on file  Social Connections: Not on file     Family History: The patient's mother and father both have hypertension, his grandmother had end-stage renal disease.  He believes that his mother has had a stroke.  ROS:   Please see the history of present illness.     All other systems reviewed and are negative.  EKGs/Labs/Other Studies Reviewed:    The following studies were reviewed today:   EKG:  EKG is ordered today.  The ekg ordered today demonstrates normal sinus rhythm, right bundle branch block, QTc 458 ms  Recent Labs: 08/28/2021: Hemoglobin 17.0; Platelets 280 10/01/2021: ALT 41; BUN 12; Creatinine, Ser 1.10; Potassium 3.5; Sodium  140  Recent Lipid Panel    Component Value Date/Time   CHOL 119 10/01/2021 0903   CHOL 235 (H) 05/15/2019 1528   TRIG 141.0 10/01/2021 0903   HDL 29.20 (L) 10/01/2021 0903   HDL 34 (L) 05/15/2019 1528   CHOLHDL 4 10/01/2021 0903   VLDL 28.2 10/01/2021 0903   LDLCALC 61 10/01/2021 0903   LDLCALC 170 (H) 05/15/2019 1528     Risk Assessment/Calculations:      The patient's blood pressure was rechecked and was still elevated.  Blood pressure medications were adjusted today by addition of nebivolol.       Physical Exam:    VS:  BP (!) 162/108   Pulse 63   Ht 5\' 9"  (1.753 m)   Wt 278 lb 6.4 oz (126.3 kg)   SpO2 97%   BMI 41.11 kg/m     Wt Readings from Last 3 Encounters:  11/11/21 278 lb 6.4 oz (126.3 kg)  10/21/21 278 lb 8 oz (126.3 kg)  10/01/21 287 lb 6 oz (130.4 kg)     GEN: Morbidly obese, but also very muscular; well nourished, well developed in no acute distress HEENT: Normal NECK: No JVD; No carotid bruits LYMPHATICS: No lymphadenopathy CARDIAC: Normal S1, widely split S2, RRR, no murmurs, rubs, gallops RESPIRATORY:  Clear to auscultation without rales, wheezing or rhonchi  ABDOMEN: Soft, non-tender, non-distended MUSCULOSKELETAL:  No edema; No deformity  SKIN: Warm and dry NEUROLOGIC:  Alert and oriented x 3 PSYCHIATRIC:  Normal affect   ASSESSMENT:    1. Renovascular hypertension   2. Apnea    PLAN:    In order of problems listed above:  HTN: Still has severely elevated blood pressure despite maximum dose of amlodipine and ARB as well as moderate dose of hydrochlorothiazide.  This is a very sensible regimen already, but insufficient.  We will add a combined alpha-beta-blocker.  We will check for renovascular hypertension with a duplex ultrasound.  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  Suspicion for pheochromocytoma is low.  Had a long  discussion regarding the importance of dietary sodium restriction, weight loss, regular exercise. Consider sleep apnea as one of the drivers for his hypertension.  Scheduled for sleep study.           Medication Adjustments/Labs and Tests Ordered: Current medicines are reviewed at length with the patient today.  Concerns regarding medicines are outlined above.  Orders Placed This Encounter  Procedures   EKG 12-Lead   Split night study   VAS 12/02/21 RENAL ARTERY DUPLEX   Meds ordered this encounter  Medications   nebivolol (BYSTOLIC) 5 MG tablet    Sig: Take 1  tablet (5 mg total) by mouth daily.    Dispense:  30 tablet    Refill:  2    Patient Instructions  Medication Instructions:  START Bystolic 5 mg once daily  *If you need a refill on your cardiac medications before your next appointment, please call your pharmacy*   Lab Work: None ordered If you have labs (blood work) drawn today and your tests are completely normal, you will receive your results only by: MyChart Message (if you have MyChart) OR A paper copy in the mail If you have any lab test that is abnormal or we need to change your treatment, we will call you to review the results.   Testing/Procedures: Your physician has requested that you have a renal artery duplex. During this test, an ultrasound is used to evaluate blood flow to the kidneys. Take your medications as you usually do. This will take place at [address], [address].  No food after 11PM the night before.  Water is OK. (Don't drink liquids if you have been instructed not to for ANOTHER test). Avoid foods that produce bowel gas, for 24 hours prior to exam (see below). No breakfast, no chewing gum, no smoking or carbonated beverages. Patient may take morning medications with water. Come in for test at least 15 minutes early to register.  Your physician has recommended that you have a sleep study. This test records several body functions during  sleep, including: brain activity, eye movement, oxygen and carbon dioxide blood levels, heart rate and rhythm, breathing rate and rhythm, the flow of air through your mouth and nose, snoring, body muscle movements, and chest and belly movement. They will call you to set this up   Follow-Up: At [facility], you and your health needs are our priority.  As part of our continuing mission to provide you with exceptional heart care, we have created designated Provider Care Teams.  These Care Teams include your primary Cardiologist (physician) and Advanced Practice Providers (APPs -  Physician Assistants and Nurse Practitioners) who all work together to provide you with the care you need, when you need it.  We recommend signing up for the patient portal called "MyChart".  Sign up information is provided on this After Visit Summary.  MyChart is used to connect with patients for Virtual Visits (Telemedicine).  Patients are able to view lab/test results, encounter notes, upcoming appointments, etc.  Non-urgent messages can be sent to your provider as well.   To learn more about what you can do with MyChart, go to [email or web address].    Your next appointment:   1 month(s)  The format for your next appointment:   In Person  Provider:   Thurmon Fair, MD or app   Other Instructions  [facility]:         Signed, Thurmon Fair, MD  11/13/2021 5:47 PM    [facility]

## 2021-11-14 ENCOUNTER — Ambulatory Visit (HOSPITAL_COMMUNITY)
Admission: RE | Admit: 2021-11-14 | Discharge: 2021-11-14 | Disposition: A | Discharge status: Home / Self Care | Payer: BLUE CROSS/BLUE SHIELD | Source: Ambulatory Visit | Attending: Cardiology | Admitting: Cardiology

## 2021-11-14 DIAGNOSIS — I150 Renovascular hypertension: Secondary | ICD-10-CM | POA: Diagnosis present

## 2021-11-19 ENCOUNTER — Other Ambulatory Visit: Payer: Self-pay | Admitting: Cardiovascular Disease

## 2021-11-19 DIAGNOSIS — R0683 Snoring: Secondary | ICD-10-CM

## 2021-11-19 DIAGNOSIS — I10 Essential (primary) hypertension: Secondary | ICD-10-CM

## 2021-11-23 ENCOUNTER — Other Ambulatory Visit: Payer: Self-pay | Admitting: Emergency Medicine

## 2021-11-23 DIAGNOSIS — E785 Hyperlipidemia, unspecified: Secondary | ICD-10-CM

## 2021-12-18 ENCOUNTER — Telehealth: Payer: Self-pay | Admitting: *Deleted

## 2021-12-18 NOTE — Telephone Encounter (Signed)
Dr Sallyanne Kuster was informed [employer] denied request for HST. We are out of network for the patient. His BCBS card states that he is assigned to [facility]. Per Dr Sallyanne Kuster he will have Kathryne Sharper, RN do a referral to them. Our order will be cancelled.

## 2021-12-19 ENCOUNTER — Ambulatory Visit: Payer: BLUE CROSS/BLUE SHIELD | Attending: Cardiovascular Disease | Admitting: Cardiovascular Disease

## 2022-02-11 ENCOUNTER — Other Ambulatory Visit: Payer: Self-pay | Admitting: Cardiovascular Disease

## 2022-10-07 ENCOUNTER — Other Ambulatory Visit: Payer: Self-pay | Admitting: Emergency Medicine

## 2022-10-07 DIAGNOSIS — E785 Hyperlipidemia, unspecified: Secondary | ICD-10-CM

## 2023-02-19 IMAGING — CT CT HEAD W/O CM
3 series · 14 of 47 positions shown, 16 images · non-contrast
Comparison: CT brain 04/14/2021

CLINICAL DATA: Headache



[Series 2: head wo · axial · 0.42mm/px · z∈[+1136,+1260]mm · 8 of 30 slices shown, 10 images]
[im 3/30  brain]
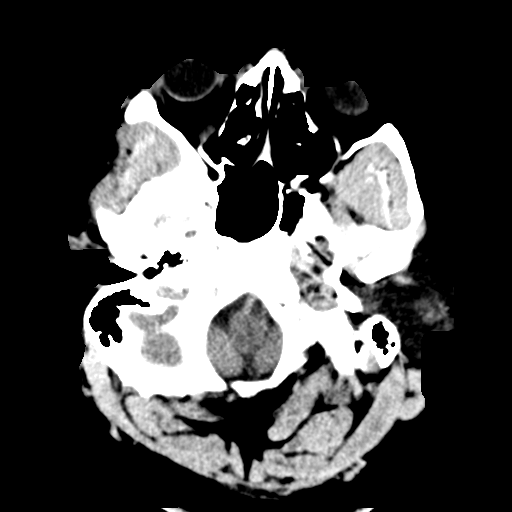
[im 3/30  bone]
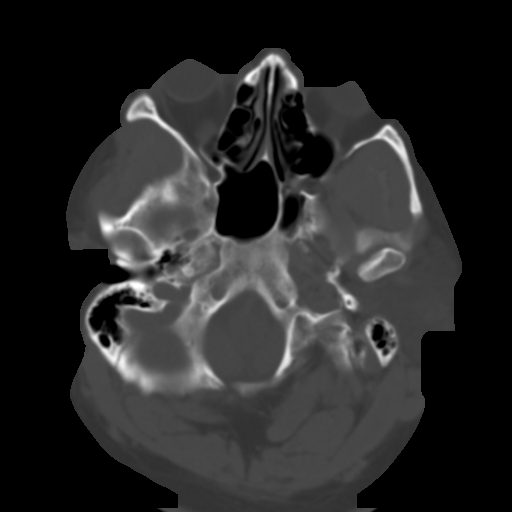
[im 7/30  brain]
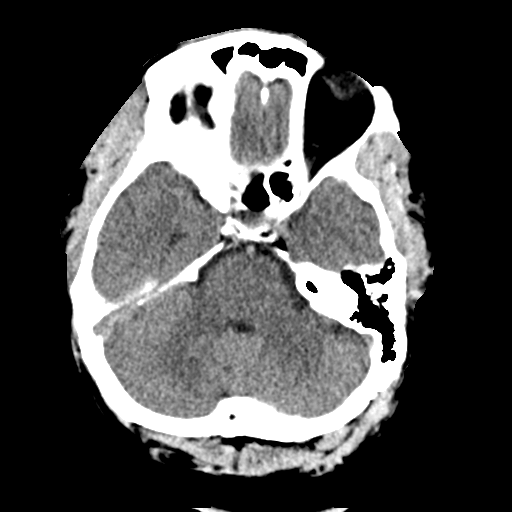
[im 10/30  brain]
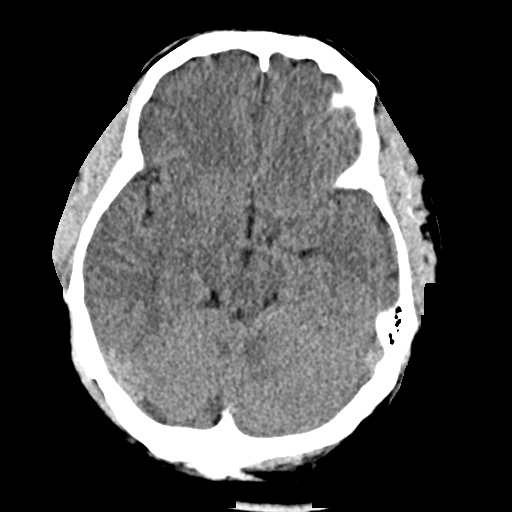
[im 14/30  brain]
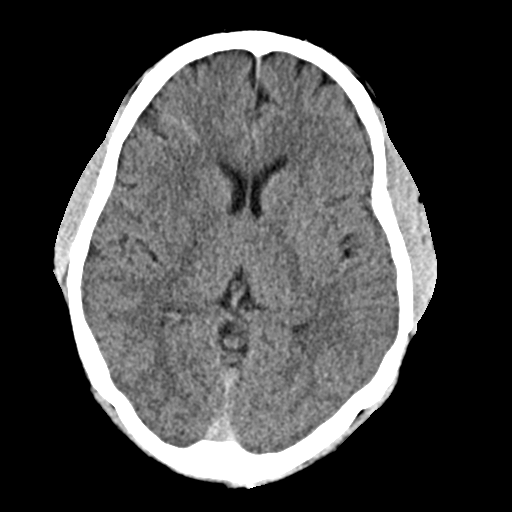
[im 17/30  brain]
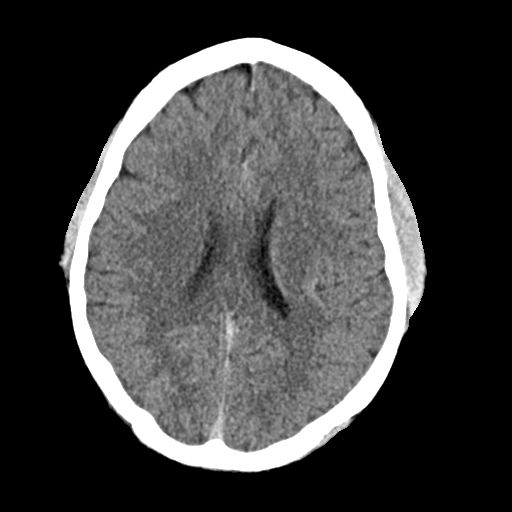
[im 17/30  bone]
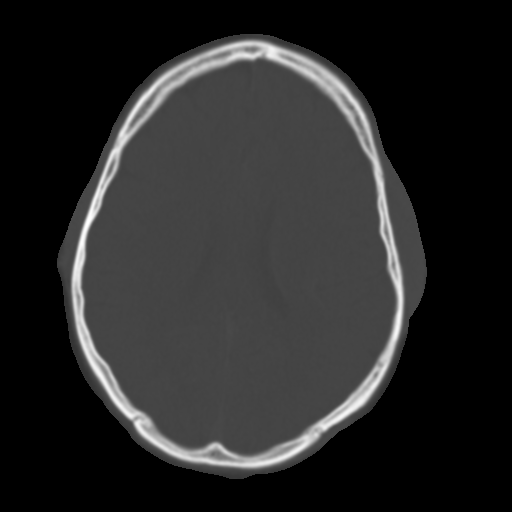
[im 21/30  brain]
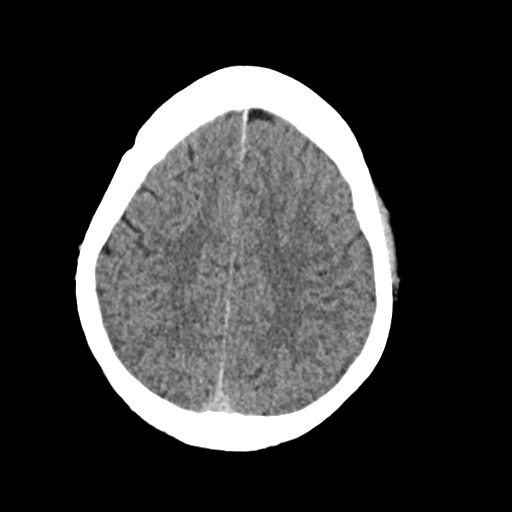
[im 24/30  brain]
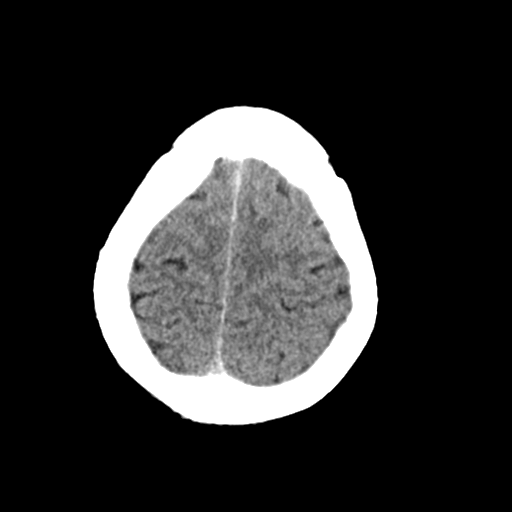
[im 28/30  brain]
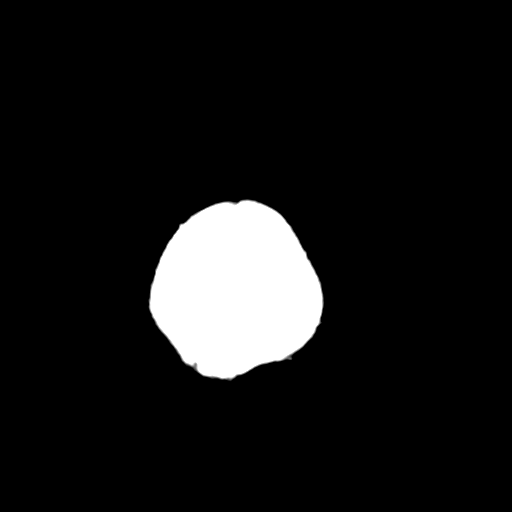

[Series 4: coronal soft · coronal · 0.31mm/px · 3 of 70 slices shown]
[im 24/70  brain]
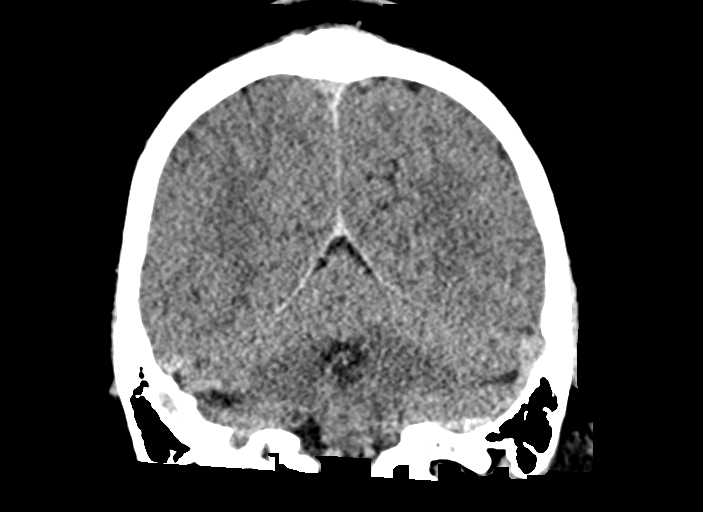
[im 31/70  brain]
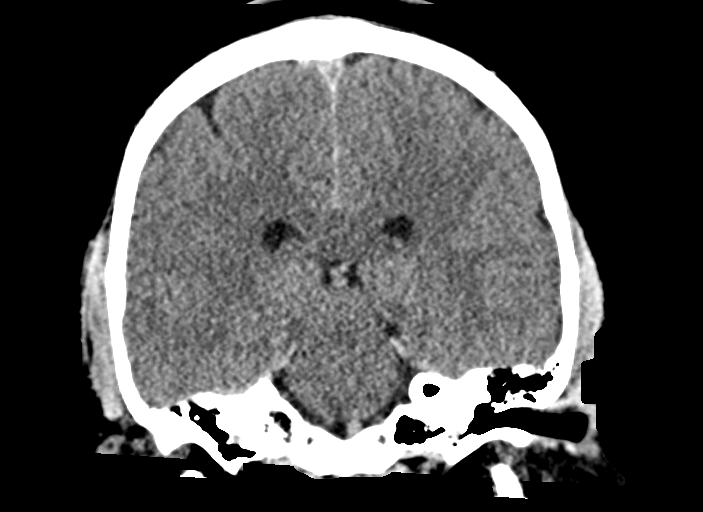
[im 39/70  brain]
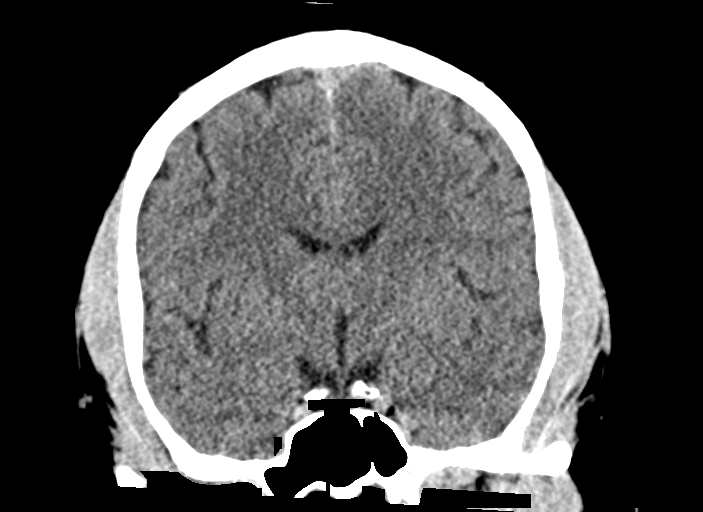

[Series 5: sag soft · sagittal · 0.29mm/px · 3 of 58 slices shown]
[im 20/58  brain]
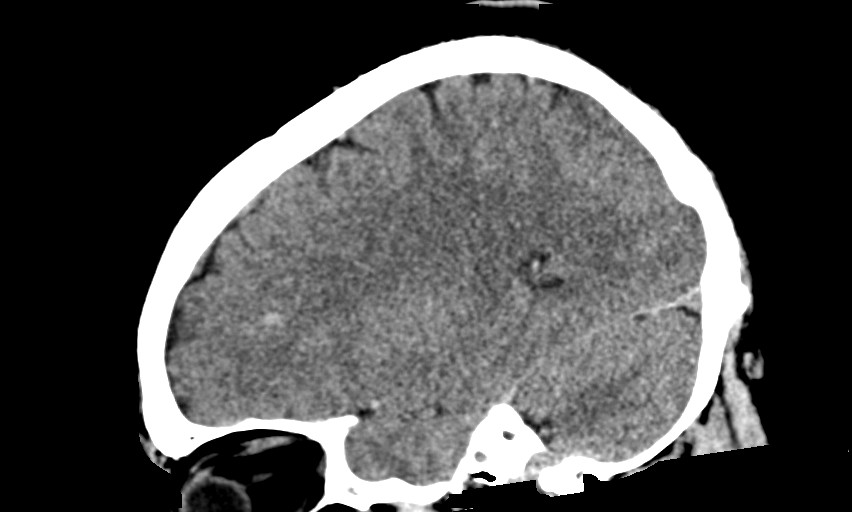
[im 29/58  brain]
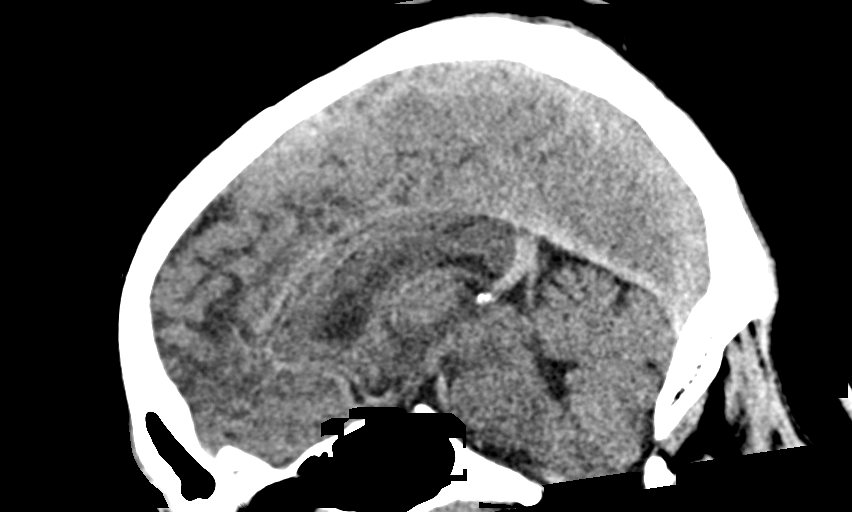
[im 39/58  brain]
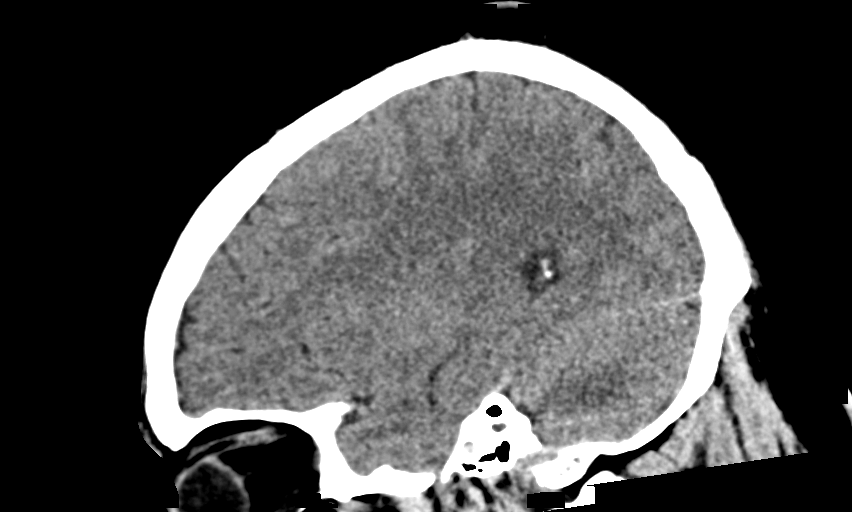

[14 of 47 positions shown; findings below may reference images not displayed]

FINDINGS: Brain: No acute territorial infarction, hemorrhage or intracranial
mass. The ventricles are nonenlarged.

Vascular: No hyperdense vessels. No unexpected calcification. Linear
hyperdensity in the right frontal lobe extending towards the cortex,
series 2 image 14, suspect that this represents a developmental
venous anomaly.

Skull: Normal. Negative for fracture or focal lesion.

Sinuses/Orbits: No acute finding.

Other: None
IMPRESSION: 1. No CT evidence for acute intracranial abnormality.
2. Probable right frontal lobe developmental venous anomaly. This
could be confirmed with MRI on a nonemergent basis.

## 2023-04-20 ENCOUNTER — Other Ambulatory Visit: Payer: Self-pay | Admitting: Emergency Medicine

## 2023-05-14 ENCOUNTER — Other Ambulatory Visit: Payer: Self-pay

## 2023-05-14 ENCOUNTER — Encounter (HOSPITAL_BASED_OUTPATIENT_CLINIC_OR_DEPARTMENT_OTHER): Payer: Self-pay | Admitting: Emergency Medicine

## 2023-05-14 ENCOUNTER — Emergency Department (HOSPITAL_BASED_OUTPATIENT_CLINIC_OR_DEPARTMENT_OTHER)
Admission: EM | Admit: 2023-05-14 | Discharge: 2023-05-15 | Disposition: A | Discharge status: Home / Self Care | Payer: 59 | Attending: Emergency Medicine | Admitting: Emergency Medicine

## 2023-05-14 DIAGNOSIS — I10 Essential (primary) hypertension: Secondary | ICD-10-CM | POA: Diagnosis present

## 2023-05-14 DIAGNOSIS — Z79899 Other long term (current) drug therapy: Secondary | ICD-10-CM | POA: Diagnosis not present

## 2023-05-14 DIAGNOSIS — R7401 Elevation of levels of liver transaminase levels: Secondary | ICD-10-CM | POA: Diagnosis not present

## 2023-05-14 DIAGNOSIS — D72829 Elevated white blood cell count, unspecified: Secondary | ICD-10-CM | POA: Insufficient documentation

## 2023-05-14 LAB — COMPREHENSIVE METABOLIC PANEL
ALT: 59 U/L — ABNORMAL HIGH (ref 0–44)
AST: 69 U/L — ABNORMAL HIGH (ref 15–41)
Albumin: 4.6 g/dL (ref 3.5–5.0)
Alkaline Phosphatase: 62 U/L (ref 38–126)
Anion gap: 12 (ref 5–15)
BUN: 9 mg/dL (ref 6–20)
CO2: 26 mmol/L (ref 22–32)
Calcium: 9 mg/dL (ref 8.9–10.3)
Chloride: 96 mmol/L — ABNORMAL LOW (ref 98–111)
Creatinine, Ser: 0.95 mg/dL (ref 0.61–1.24)
GFR, Estimated: >60 mL/min (ref >60)
Glucose, Bld: 102 mg/dL — ABNORMAL HIGH (ref 70–99)
Potassium: 3.2 mmol/L — ABNORMAL LOW (ref 3.5–5.1)
Sodium: 134 mmol/L — ABNORMAL LOW (ref 135–145)
Total Bilirubin: 1 mg/dL (ref 0.0–1.2)
Total Protein: 8.3 g/dL — ABNORMAL HIGH (ref 6.5–8.1)

## 2023-05-14 LAB — CBC WITH DIFFERENTIAL/PLATELET
Abs Immature Granulocytes: 0.03 10*3/uL (ref 0.00–0.07)
Basophils Absolute: 0.1 10*3/uL (ref 0.0–0.1)
Basophils Relative: 1 %
Eosinophils Absolute: 0 10*3/uL (ref 0.0–0.5)
Eosinophils Relative: 0 %
HCT: 48.2 % (ref 39.0–52.0)
Hemoglobin: 16.8 g/dL (ref 13.0–17.0)
Immature Granulocytes: 0 %
Lymphocytes Relative: 15 %
Lymphs Abs: 1.7 10*3/uL (ref 0.7–4.0)
MCH: 28.6 pg (ref 26.0–34.0)
MCHC: 34.9 g/dL (ref 30.0–36.0)
MCV: 82 fL (ref 80.0–100.0)
Monocytes Absolute: 0.7 10*3/uL (ref 0.1–1.0)
Monocytes Relative: 6 %
Neutro Abs: 9.3 10*3/uL — ABNORMAL HIGH (ref 1.7–7.7)
Neutrophils Relative %: 78 %
Platelets: 319 10*3/uL (ref 150–400)
RBC: 5.88 MIL/uL — ABNORMAL HIGH (ref 4.22–5.81)
RDW: 12.3 % (ref 11.5–15.5)
WBC: 11.9 10*3/uL — ABNORMAL HIGH (ref 4.0–10.5)
nRBC: 0 % (ref 0.0–0.2)

## 2023-05-14 MED ORDER — SODIUM CHLORIDE 0.9 % IV BOLUS
1000.0000 mL | Freq: Once | INTRAVENOUS | Status: AC
  Administered 2023-05-14: 1000 mL via INTRAVENOUS

## 2023-05-14 MED ORDER — LABETALOL HCL 5 MG/ML IV SOLN
10.0000 mg | Freq: Once | INTRAVENOUS | Status: AC
  Administered 2023-05-15: 10 mg via INTRAVENOUS
  Filled 2023-05-14: qty 4

## 2023-05-14 MED ORDER — CLONIDINE HCL 0.1 MG PO TABS
0.1000 mg | ORAL_TABLET | Freq: Once | ORAL | Status: AC
  Administered 2023-05-14: 0.1 mg via ORAL
  Filled 2023-05-14: qty 1

## 2023-05-14 MED ORDER — LABETALOL HCL 5 MG/ML IV SOLN
10.0000 mg | Freq: Once | INTRAVENOUS | Status: AC
  Administered 2023-05-14: 10 mg via INTRAVENOUS
  Filled 2023-05-14: qty 4

## 2023-05-14 NOTE — ED Provider Notes (Signed)
 [facility] Provider Note   CSN: [id number] Arrival date & time: 05/14/23  1929    History  Chief Complaint  Patient presents with   Hypertension    Antonio Rodgers is a 43 y.o. male here for evaluation of elevated blood pressure and dehydration. States he is blood pressure is elevated.  He has missed his blood pressure medication over the last few days.  He was drinking alcohol last night and today he felt like he was dehydrated.  He denies any nausea or vomiting.  No headache, blurred vision, neck pain, chest pain, shortness of breath, abdominal pain.  He feels like he needs IV fluids.  He did not take his blood pressure at home however felt it was "high."    HPI     Home Medications Prior to Admission medications   Medication Sig Start Date End Date Taking? Authorizing Provider  amLODipine (NORVASC) 10 MG tablet TAKE 1 TABLET BY MOUTH EVERY DAY 10/07/22   Georgina Quint, MD  azelastine (OPTIVAR) 0.05 % ophthalmic solution INSTILL 1 DROP INTO BOTH EYES TWICE A DAY 04/21/23   Sagardia, Eilleen Kempf, MD  nebivolol (BYSTOLIC) 5 MG tablet TAKE 1 TABLET (5 MG TOTAL) BY MOUTH DAILY. 02/11/22   Croitoru, Mihai, MD  rosuvastatin (CRESTOR) 20 MG tablet TAKE 1 TABLET BY MOUTH EVERY DAY 10/07/22   Georgina Quint, MD  valsartan-hydrochlorothiazide (DIOVAN-HCT) 320-12.5 MG tablet Take 1 tablet by mouth daily. 10/01/21   Georgina Quint, MD      Allergies    Other    Review of Systems   Review of Systems  Constitutional: Negative.   HENT: Negative.    Respiratory: Negative.    Cardiovascular: Negative.   Gastrointestinal: Negative.   Genitourinary: Negative.   Musculoskeletal: Negative.   Skin: Negative.   Neurological: Negative.   All other systems reviewed and are negative.   Physical Exam Updated Vital Signs BP (!) 217/117   Pulse 88   Temp 98.2 F (36.8 C)   Resp (!) 21   Ht 5\' 9"  (1.753 m)   Wt 129.3 kg   SpO2  100%   BMI 42.09 kg/m  Physical Exam Vitals and nursing note reviewed.  Constitutional:      General: He is not in acute distress.    Appearance: He is well-developed. He is not ill-appearing, toxic-appearing or diaphoretic.  HENT:     Head: Normocephalic and atraumatic.     Nose: Nose normal.     Mouth/Throat:     Mouth: Mucous membranes are moist.  Eyes:     Pupils: Pupils are equal, round, and reactive to light.  Cardiovascular:     Rate and Rhythm: Normal rate and regular rhythm.     Pulses: Normal pulses.     Heart sounds: Normal heart sounds.  Pulmonary:     Effort: Pulmonary effort is normal. No respiratory distress.     Breath sounds: Normal breath sounds.  Abdominal:     General: There is no distension.     Palpations: Abdomen is soft.  Musculoskeletal:        General: No swelling. Normal range of motion.     Cervical back: Normal range of motion and neck supple.  Skin:    General: Skin is warm and dry.     Capillary Refill: Capillary refill takes less than 2 seconds.  Neurological:     General: No focal deficit present.     Mental Status:  He is alert and oriented to person, place, and time.     Cranial Nerves: No cranial nerve deficit.     Sensory: No sensory deficit.     Motor: No weakness.     Coordination: Coordination normal.     Gait: Gait normal.    ED Results / Procedures / Treatments   Labs (all labs ordered are listed, but only abnormal results are displayed) Labs Reviewed  CBC WITH DIFFERENTIAL/PLATELET - Abnormal; Notable for the following components:      Result Value   WBC 11.9 (*)    RBC 5.88 (*)    Neutro Abs 9.3 (*)    All other components within normal limits  COMPREHENSIVE METABOLIC PANEL - Abnormal; Notable for the following components:   Sodium 134 (*)    Potassium 3.2 (*)    Chloride 96 (*)    Glucose, Bld 102 (*)    Total Protein 8.3 (*)    AST 69 (*)    ALT 59 (*)    All other components within normal limits     EKG None  Radiology No results found.  Procedures Procedures    Medications Ordered in ED Medications  labetalol (NORMODYNE) injection 10 mg (has no administration in time range)  cloNIDine (CATAPRES) tablet 0.1 mg (0.1 mg Oral Given 05/14/23 2201)  sodium chloride 0.9 % bolus 1,000 mL (0 mLs Intravenous Stopped 05/14/23 2319)  labetalol (NORMODYNE) injection 10 mg (10 mg Intravenous Given 05/14/23 2314)   ED Course/ Medical Decision Making/ A&P   43 year old here for evaluation of concern for dehydration and elevated blood pressure.  He has history of similar.  Per chart review has a history of medication noncompliance.  He states he thinks his last dose of his BP meds was on Wednesday.  He was drinking alcohol yesterday and today felt nauseous however no vomiting.  He feels like he is dehydrated.  No sudden onset thunderclap headache.  No vision changes, numbness, weakness.  No chest pain, shortness of breath.  He is significantly hypertensive here however he appears to have significantly elevated blood pressures on prior ED visits as well as outpatient visits.  Will plan on checking labs, BP control and reassess  Labs personally viewed and interpreted:  CBC leukocytosis 11.9 Metabolic panel sodium 134, potassium 3.2, mild LFT elevation, similar to prior EKG without ischemic changes  BP improving with meds.  Care transferred to oncoming provider who will FU on repeat Bp after this and labetalol.  Patient remains asymptomatic. Dispo per oncoming provider                                Medical Decision Making Amount and/or Complexity of Data Reviewed Independent Historian: friend External Data Reviewed: labs, ECG and notes. Labs: ordered. Decision-making details documented in ED Course. ECG/medicine tests: ordered and independent interpretation performed. Decision-making details documented in ED Course.  Risk OTC drugs. Prescription drug management. Parenteral controlled  substances. Decision regarding hospitalization. Diagnosis or treatment significantly limited by social determinants of health.          Final Clinical Impression(s) / ED Diagnoses Final diagnoses:  Hypertension, unspecified type    Rx / DC Orders ED Discharge Orders     None         Raimundo Corbit A, PA-C 05/14/23 2358    Pricilla Loveless, MD 05/19/23 1055

## 2023-05-14 NOTE — ED Triage Notes (Signed)
 Pt POV steady gait- c/o elevated BP and possible dehydration due to hangover from drinking last night.   Denies n/v/d. Reports good PO intake. Did not take BP meds today. Denies CP, ShOB.

## 2023-05-14 NOTE — ED Provider Notes (Signed)
 Blood pressure (!) 217/117, pulse 88, temperature 98.2 F (36.8 C), resp. rate (!) 21, height 5\' 9"  (1.753 m), weight 129.3 kg, SpO2 100%.  Assuming care from [place], PA-C.  In short, Antonio Rodgers is a 43 y.o. male with a chief complaint of Hypertension .  Refer to the original H&P for additional details.  The current plan of care is to follow up after additional labetalol.  12:43 AM  BP continuing to trend downward. Patient has refills of his HTN medication and is established with his PCP. Encouraged him to remain compliant with meds and call PCP office on Monday for close follow up. No evidence of HTN emergency.    Maia Plan, MD 05/15/23 [phone]

## 2023-05-14 NOTE — ED Notes (Signed)
Patient ambulated to restroom with no assistance needed.

## 2023-05-15 DIAGNOSIS — I10 Essential (primary) hypertension: Secondary | ICD-10-CM | POA: Diagnosis not present

## 2023-05-15 NOTE — Discharge Instructions (Signed)
As we discussed, though you do have high blood pressure (hypertension), fortunately it is not immediately dangerous at this time and does not need emergency intervention or admission to the hospital.  If we add to or change your regular medications, we may cause more harm than good - it is more appropriate for your primary care doctor to evaluate you in clinic and decide if any medication changes are needed.  Please follow up in clinic as recommended in these papers.   ° °Return to the Emergency Department (ED) if you experience any worsening chest pain/pressure/tightness, difficulty breathing, or sudden sweating, or other symptoms that concern you. °

## 2023-05-18 ENCOUNTER — Telehealth: Payer: Self-pay

## 2023-05-18 NOTE — Telephone Encounter (Signed)
 Tried calling patient back could not leave voice mail.

## 2023-05-18 NOTE — Telephone Encounter (Signed)
 Copied from CRM [id number]. Topic: Clinical - Medication Question >> May 17, 2023  5:12 PM Sonny Dandy B wrote: Reason for CRM: Pt called to speak with provider regarding medication. Zepbound 2.5 mgs. Pt states his blood pressure has been elevated since taken the medication . As a result pt was rushed to the ER. Pt would like to speak to the provider, would  like to know if he should stop taking the medication. Please call pt back at [phone]

## 2023-05-18 NOTE — Telephone Encounter (Signed)
 He is taking 3 blood pressure medications.  Probably needs to reduce or stop one of them.  He can continue on Zepbound.  Thanks.

## 2023-05-18 NOTE — Transitions of Care (Post Inpatient/ED Visit) (Signed)
   05/18/2023  Name: Antonio Rodgers MRN: [id number] DOB: 1980/10/04  Today's TOC FU Call Status: Today's TOC FU Call Status:: Unsuccessful Call (1st Attempt) Unsuccessful Call (1st Attempt) Date: 05/18/23  Attempted to reach the patient regarding the most recent Inpatient/ED visit.  Follow Up Plan: Additional outreach attempts will be made to reach the patient to complete the Transitions of Care (Post Inpatient/ED visit) call.   Signature Delsa Grana, RMA

## 2023-05-18 NOTE — Telephone Encounter (Signed)
 Copied from CRM [id number]. Topic: Clinical - Medication Question >> May 17, 2023  5:12 PM Antonio Rodgers B wrote: Reason for CRM: Pt called to speak with provider regarding medication. Zepbound 2.5 mgs. Pt states his blood pressure has been elevated since taken the medication . As a result pt was rushed to the ER. Pt would like to speak to the provider, would  like to know if he should stop taking the medication. Please call pt back at [phone]

## 2023-05-19 ENCOUNTER — Encounter: Payer: Self-pay | Admitting: Emergency Medicine

## 2023-05-19 ENCOUNTER — Ambulatory Visit (INDEPENDENT_AMBULATORY_CARE_PROVIDER_SITE_OTHER): Payer: 59 | Admitting: Emergency Medicine

## 2023-05-19 VITALS — BP 168/112 | HR 88 | Temp 98.3°F | Ht 69.0 in | Wt 288.0 lb

## 2023-05-19 DIAGNOSIS — I10 Essential (primary) hypertension: Secondary | ICD-10-CM

## 2023-05-19 MED ORDER — VALSARTAN-HYDROCHLOROTHIAZIDE 320-12.5 MG PO TABS: 1.0000 | ORAL_TABLET | Freq: Every day | ORAL | 3 refills | Status: AC

## 2023-05-19 NOTE — Assessment & Plan Note (Signed)
 Diet and nutrition discussed Cardiovascular risk associated with obesity discussed Benefits of exercise discussed Advised to decrease amount of daily carbohydrate intake and daily calories and increase amount of plant-based protein in his diet Was recently started on Zepbound.  Tolerating side effects well Recommend to continue 2.5 mg weekly for 2 weeks and bump it up to 5 mg weekly then.  Will send new prescription for Zepbound at 5 mg daily then.

## 2023-05-19 NOTE — Patient Instructions (Signed)
 Continue amlodipine 10 mg daily Restart valsartan HCT 320-12.5 mg daily Monitor blood pressure readings at home daily for the next several weeks and keep a log.  Contact the office if numbers persistently abnormal.  Hypertension, Adult High blood pressure (hypertension) is when the force of blood pumping through the arteries is too strong. The arteries are the blood vessels that carry blood from the heart throughout the body. Hypertension forces the heart to work harder to pump blood and may cause arteries to become narrow or stiff. Untreated or uncontrolled hypertension can lead to a heart attack, heart failure, a stroke, kidney disease, and other problems. A blood pressure reading consists of a higher number over a lower number. Ideally, your blood pressure should be below 120/80. The first ("top") number is called the systolic pressure. It is a measure of the pressure in your arteries as your heart beats. The second ("bottom") number is called the diastolic pressure. It is a measure of the pressure in your arteries as the heart relaxes. What are the causes? The exact cause of this condition is not known. There are some conditions that result in high blood pressure. What increases the risk? Certain factors may make you more likely to develop high blood pressure. Some of these risk factors are under your control, including: Smoking. Not getting enough exercise or physical activity. Being overweight. Having too much fat, sugar, calories, or salt (sodium) in your diet. Drinking too much alcohol. Other risk factors include: Having a personal history of heart disease, diabetes, high cholesterol, or kidney disease. Stress. Having a family history of high blood pressure and high cholesterol. Having obstructive sleep apnea. Age. The risk increases with age. What are the signs or symptoms? High blood pressure may not cause symptoms. Very high blood pressure (hypertensive crisis) may  cause: Headache. Fast or irregular heartbeats (palpitations). Shortness of breath. Nosebleed. Nausea and vomiting. Vision changes. Severe chest pain, dizziness, and seizures. How is this diagnosed? This condition is diagnosed by measuring your blood pressure while you are seated, with your arm resting on a flat surface, your legs uncrossed, and your feet flat on the floor. The cuff of the blood pressure monitor will be placed directly against the skin of your upper arm at the level of your heart. Blood pressure should be measured at least twice using the same arm. Certain conditions can cause a difference in blood pressure between your right and left arms. If you have a high blood pressure reading during one visit or you have normal blood pressure with other risk factors, you may be asked to: Return on a different day to have your blood pressure checked again. Monitor your blood pressure at home for 1 week or longer. If you are diagnosed with hypertension, you may have other blood or imaging tests to help your health care provider understand your overall risk for other conditions. How is this treated? This condition is treated by making healthy lifestyle changes, such as eating healthy foods, exercising more, and reducing your alcohol intake. You may be referred for counseling on a healthy diet and physical activity. Your health care provider may prescribe medicine if lifestyle changes are not enough to get your blood pressure under control and if: Your systolic blood pressure is above 130. Your diastolic blood pressure is above 80. Your personal target blood pressure may vary depending on your medical conditions, your age, and other factors. Follow these instructions at home: Eating and drinking  Eat a diet that is high  in fiber and potassium, and low in sodium, added sugar, and fat. An example of this eating plan is called the DASH diet. DASH stands for Dietary Approaches to Stop  Hypertension. To eat this way: Eat plenty of fresh fruits and vegetables. Try to fill one half of your plate at each meal with fruits and vegetables. Eat whole grains, such as whole-wheat pasta, brown rice, or whole-grain bread. Fill about one fourth of your plate with whole grains. Eat or drink low-fat dairy products, such as skim milk or low-fat yogurt. Avoid fatty cuts of meat, processed or cured meats, and poultry with skin. Fill about one fourth of your plate with lean proteins, such as fish, chicken without skin, beans, eggs, or tofu. Avoid pre-made and processed foods. These tend to be higher in sodium, added sugar, and fat. Reduce your daily sodium intake. Many people with hypertension should eat less than 1,500 mg of sodium a day. Do not drink alcohol if: Your health care provider tells you not to drink. You are pregnant, may be pregnant, or are planning to become pregnant. If you drink alcohol: Limit how much you have to: 0-1 drink a day for women. 0-2 drinks a day for men. Know how much alcohol is in your drink. In the [place] one drink equals one 12 oz bottle of beer (355 mL), one 5 oz glass of wine (148 mL), or one 1 oz glass of hard liquor (44 mL). Lifestyle  Work with your health care provider to maintain a healthy body weight or to lose weight. Ask what an ideal weight is for you. Get at least 30 minutes of exercise that causes your heart to beat faster (aerobic exercise) most days of the week. Activities may include walking, swimming, or biking. Include exercise to strengthen your muscles (resistance exercise), such as Pilates or lifting weights, as part of your weekly exercise routine. Try to do these types of exercises for 30 minutes at least 3 days a week. Do not use any products that contain nicotine or tobacco. These products include cigarettes, chewing tobacco, and vaping devices, such as e-cigarettes. If you need help quitting, ask your health care provider. Monitor your  blood pressure at home as told by your health care provider. Keep all follow-up visits. This is important. Medicines Take over-the-counter and prescription medicines only as told by your health care provider. Follow directions carefully. Blood pressure medicines must be taken as prescribed. Do not skip doses of blood pressure medicine. Doing this puts you at risk for problems and can make the medicine less effective. Ask your health care provider about side effects or reactions to medicines that you should watch for. Contact a health care provider if you: Think you are having a reaction to a medicine you are taking. Have headaches that keep coming back (recurring). Feel dizzy. Have swelling in your ankles. Have trouble with your vision. Get help right away if you: Develop a severe headache or confusion. Have unusual weakness or numbness. Feel faint. Have severe pain in your chest or abdomen. Vomit repeatedly. Have trouble breathing. These symptoms may be an emergency. Get help right away. Call 911. Do not wait to see if the symptoms will go away. Do not drive yourself to the hospital. Summary Hypertension is when the force of blood pumping through your arteries is too strong. If this condition is not controlled, it may put you at risk for serious complications. Your personal target blood pressure may vary depending on your medical conditions,  your age, and other factors. For most people, a normal blood pressure is less than 120/80. Hypertension is treated with lifestyle changes, medicines, or a combination of both. Lifestyle changes include losing weight, eating a healthy, low-sodium diet, exercising more, and limiting alcohol. This information is not intended to replace advice given to you by your health care provider. Make sure you discuss any questions you have with your health care provider. Document Revised: 01/14/2021 Document Reviewed: 01/14/2021 Elsevier Patient Education  2024  [employer].

## 2023-05-19 NOTE — Progress Notes (Signed)
 Antonio Rodgers 43 y.o.   Chief Complaint  Patient presents with   Hypertension    Patient states He was drinking alcohol Wed&Thurs. night and states Friday his bp shot up so he went to the ED and they gave him meds to help bring this down and then told him to follow up with pcp. He states he feels better and has not checked his bp at home yet he is waiting on his new bp cuff. Also had questions about the zepbound. He also mentions wanting his skin tag on the back of neck looked at.    HISTORY OF PRESENT ILLNESS: This is a 43 y.o. male here for follow-up of hypertension Last office visit with me in 2023 Recent ED visit due to hypertension. Presently on amlodipine and low-dose nebivolol Started on Zepbound 2 weeks ago.  Tolerating side effects well. BP Readings from Last 3 Encounters:  05/19/23 (!) 168/112  05/15/23 (!) 184/110  11/13/21 (!) 162/108     Hypertension Pertinent negatives include no chest pain, headaches, palpitations or shortness of breath.     Prior to Admission medications   Medication Sig Start Date End Date Taking? Authorizing Provider  amLODipine (NORVASC) 10 MG tablet TAKE 1 TABLET BY MOUTH EVERY DAY 10/07/22  Yes Kally Cadden, Eilleen Kempf, MD  azelastine (OPTIVAR) 0.05 % ophthalmic solution INSTILL 1 DROP INTO BOTH EYES TWICE A DAY 04/21/23  Yes Daejah Klebba, Eilleen Kempf, MD  rosuvastatin (CRESTOR) 20 MG tablet TAKE 1 TABLET BY MOUTH EVERY DAY 10/07/22  Yes Irbin Fines, Eilleen Kempf, MD  valsartan-hydrochlorothiazide (DIOVAN-HCT) 320-12.5 MG tablet Take 1 tablet by mouth daily. 10/01/21  Yes Hans Rusher, Eilleen Kempf, MD  ZEPBOUND 2.5 MG/0.5ML Pen Inject 2.5 mg into the skin once a week. 04/27/23  Yes [provider]  nebivolol (BYSTOLIC) 5 MG tablet TAKE 1 TABLET (5 MG TOTAL) BY MOUTH DAILY. Patient not taking: Reported on 05/19/2023 02/11/22   Croitoru, Rachelle Hora, MD    Allergies  Allergen Reactions   Other Other (See Comments)    Family history of malignant hyperthermia  under general anesthesia; pt himself has not undergone general anesthesia    Patient Active Problem List   Diagnosis Date Noted   Morbid obesity (HCC) 05/19/2023   Essential hypertension 11/18/2014    Past Medical History:  Diagnosis Date   Hypertension     Past Surgical History:  Procedure Laterality Date   KNEE SURGERY      Social History   Socioeconomic History   Marital status: Single    Spouse name: Not on file   Number of children: Not on file   Years of education: Not on file   Highest education level: Not on file  Occupational History   Not on file  Tobacco Use   Smoking status: Never   Smokeless tobacco: Never  Vaping Use   Vaping status: Never Used  Substance and Sexual Activity   Alcohol use: Yes    Alcohol/week: 0.0 standard drinks of alcohol    Comment: Weekends   Drug use: No   Sexual activity: Not on file  Other Topics Concern   Not on file  Social History Narrative   Not on file   Social Drivers of Health   Financial Resource Strain: Not on file  Food Insecurity: Not on file  Transportation Needs: Not on file  Physical Activity: Not on file  Stress: Not on file  Social Connections: Unknown (09/13/2021)   Received from [facility], [facility]   Social Network  Social Network: Not on file  Intimate Partner Violence: Unknown (09/13/2021)   Received from [facility], [facility]   HITS    Physically Hurt: Not on file    Insult or Talk Down To: Not on file    Threaten Physical Harm: Not on file    Scream or Curse: Not on file    No family history on file.   Review of Systems  Constitutional: Negative.  Negative for chills and fever.  HENT: Negative.  Negative for congestion and sore throat.   Respiratory: Negative.  Negative for cough and shortness of breath.   Cardiovascular: Negative.  Negative for chest pain and palpitations.  Gastrointestinal:  Negative for abdominal pain, diarrhea, nausea and vomiting.  Genitourinary:  Negative.  Negative for dysuria and hematuria.  Skin: Negative.  Negative for rash.  Neurological: Negative.  Negative for dizziness and headaches.  All other systems reviewed and are negative.   Today's Vitals   05/19/23 1452  BP: (!) 168/112  Pulse: 88  Temp: 98.3 F (36.8 C)  TempSrc: Oral  SpO2: 98%  Weight: 288 lb (130.6 kg)  Height: 5\' 9"  (1.753 m)   Body mass index is 42.53 kg/m.   Physical Exam Vitals reviewed.  Constitutional:      Appearance: Normal appearance. He is obese.  HENT:     Head: Normocephalic.     Mouth/Throat:     Mouth: Mucous membranes are moist.     Pharynx: Oropharynx is clear.  Eyes:     Extraocular Movements: Extraocular movements intact.     Pupils: Pupils are equal, round, and reactive to light.  Cardiovascular:     Rate and Rhythm: Normal rate and regular rhythm.     Pulses: Normal pulses.     Heart sounds: Normal heart sounds.  Pulmonary:     Effort: Pulmonary effort is normal.     Breath sounds: Normal breath sounds.  Musculoskeletal:     Cervical back: No tenderness.  Lymphadenopathy:     Cervical: No cervical adenopathy.  Skin:    General: Skin is warm and dry.     Capillary Refill: Capillary refill takes less than 2 seconds.  Neurological:     General: No focal deficit present.     Mental Status: He is alert and oriented to person, place, and time.  Psychiatric:        Mood and Affect: Mood normal.        Behavior: Behavior normal.      ASSESSMENT & PLAN: A total of 47 minutes was spent with the patient and counseling/coordination of care regarding preparing for this visit, review of most recent office visit notes, review of most recent emergency department visit notes, review of multiple chronic medical conditions and their management, diagnosis of hypertension and cardiovascular risks associated with this condition, review of all medications and changes made, review of most recent bloodwork results, review of health  maintenance items, education on nutrition, prognosis, documentation, and need for follow up.   Problem List Items Addressed This Visit       Cardiovascular and Mediastinum   Essential hypertension - Primary   Uncontrolled hypertension Has been taking amlodipine and nebivolol Not taking valsartan HCT at present time Cardiovascular risks associated with uncontrolled hypertension discussed Dietary approaches to stop hypertension discussed Benefits of exercise discussed Recommend to continue amlodipine 10 mg daily and restart valsartan HCT 320-12.5 mg daily Advised to monitor blood pressure readings at home several times a day for the next  several weeks and keep a log.  Advised to contact the office if numbers persistently abnormal Follow-up in 3 months, earlier as needed.      Relevant Medications   valsartan-hydrochlorothiazide (DIOVAN-HCT) 320-12.5 MG tablet     Other   Morbid obesity (HCC)   Diet and nutrition discussed Cardiovascular risk associated with obesity discussed Benefits of exercise discussed Advised to decrease amount of daily carbohydrate intake and daily calories and increase amount of plant-based protein in his diet Was recently started on Zepbound.  Tolerating side effects well Recommend to continue 2.5 mg weekly for 2 weeks and bump it up to 5 mg weekly then.  Will send new prescription for Zepbound at 5 mg daily then.      Relevant Medications   ZEPBOUND 2.5 MG/0.5ML Pen     Patient Instructions  Continue amlodipine 10 mg daily Restart valsartan HCT 320-12.5 mg daily Monitor blood pressure readings at home daily for the next several weeks and keep a log.  Contact the office if numbers persistently abnormal.  Hypertension, Adult High blood pressure (hypertension) is when the force of blood pumping through the arteries is too strong. The arteries are the blood vessels that carry blood from the heart throughout the body. Hypertension forces the heart to work  harder to pump blood and may cause arteries to become narrow or stiff. Untreated or uncontrolled hypertension can lead to a heart attack, heart failure, a stroke, kidney disease, and other problems. A blood pressure reading consists of a higher number over a lower number. Ideally, your blood pressure should be below 120/80. The first ("top") number is called the systolic pressure. It is a measure of the pressure in your arteries as your heart beats. The second ("bottom") number is called the diastolic pressure. It is a measure of the pressure in your arteries as the heart relaxes. What are the causes? The exact cause of this condition is not known. There are some conditions that result in high blood pressure. What increases the risk? Certain factors may make you more likely to develop high blood pressure. Some of these risk factors are under your control, including: Smoking. Not getting enough exercise or physical activity. Being overweight. Having too much fat, sugar, calories, or salt (sodium) in your diet. Drinking too much alcohol. Other risk factors include: Having a personal history of heart disease, diabetes, high cholesterol, or kidney disease. Stress. Having a family history of high blood pressure and high cholesterol. Having obstructive sleep apnea. Age. The risk increases with age. What are the signs or symptoms? High blood pressure may not cause symptoms. Very high blood pressure (hypertensive crisis) may cause: Headache. Fast or irregular heartbeats (palpitations). Shortness of breath. Nosebleed. Nausea and vomiting. Vision changes. Severe chest pain, dizziness, and seizures. How is this diagnosed? This condition is diagnosed by measuring your blood pressure while you are seated, with your arm resting on a flat surface, your legs uncrossed, and your feet flat on the floor. The cuff of the blood pressure monitor will be placed directly against the skin of your upper arm at the  level of your heart. Blood pressure should be measured at least twice using the same arm. Certain conditions can cause a difference in blood pressure between your right and left arms. If you have a high blood pressure reading during one visit or you have normal blood pressure with other risk factors, you may be asked to: Return on a different day to have your blood pressure checked again.  Monitor your blood pressure at home for 1 week or longer. If you are diagnosed with hypertension, you may have other blood or imaging tests to help your health care provider understand your overall risk for other conditions. How is this treated? This condition is treated by making healthy lifestyle changes, such as eating healthy foods, exercising more, and reducing your alcohol intake. You may be referred for counseling on a healthy diet and physical activity. Your health care provider may prescribe medicine if lifestyle changes are not enough to get your blood pressure under control and if: Your systolic blood pressure is above 130. Your diastolic blood pressure is above 80. Your personal target blood pressure may vary depending on your medical conditions, your age, and other factors. Follow these instructions at home: Eating and drinking  Eat a diet that is high in fiber and potassium, and low in sodium, added sugar, and fat. An example of this eating plan is called the DASH diet. DASH stands for Dietary Approaches to Stop Hypertension. To eat this way: Eat plenty of fresh fruits and vegetables. Try to fill one half of your plate at each meal with fruits and vegetables. Eat whole grains, such as whole-wheat pasta, brown rice, or whole-grain bread. Fill about one fourth of your plate with whole grains. Eat or drink low-fat dairy products, such as skim milk or low-fat yogurt. Avoid fatty cuts of meat, processed or cured meats, and poultry with skin. Fill about one fourth of your plate with lean proteins, such as  fish, chicken without skin, beans, eggs, or tofu. Avoid pre-made and processed foods. These tend to be higher in sodium, added sugar, and fat. Reduce your daily sodium intake. Many people with hypertension should eat less than 1,500 mg of sodium a day. Do not drink alcohol if: Your health care provider tells you not to drink. You are pregnant, may be pregnant, or are planning to become pregnant. If you drink alcohol: Limit how much you have to: 0-1 drink a day for women. 0-2 drinks a day for men. Know how much alcohol is in your drink. In the [place] one drink equals one 12 oz bottle of beer (355 mL), one 5 oz glass of wine (148 mL), or one 1 oz glass of hard liquor (44 mL). Lifestyle  Work with your health care provider to maintain a healthy body weight or to lose weight. Ask what an ideal weight is for you. Get at least 30 minutes of exercise that causes your heart to beat faster (aerobic exercise) most days of the week. Activities may include walking, swimming, or biking. Include exercise to strengthen your muscles (resistance exercise), such as Pilates or lifting weights, as part of your weekly exercise routine. Try to do these types of exercises for 30 minutes at least 3 days a week. Do not use any products that contain nicotine or tobacco. These products include cigarettes, chewing tobacco, and vaping devices, such as e-cigarettes. If you need help quitting, ask your health care provider. Monitor your blood pressure at home as told by your health care provider. Keep all follow-up visits. This is important. Medicines Take over-the-counter and prescription medicines only as told by your health care provider. Follow directions carefully. Blood pressure medicines must be taken as prescribed. Do not skip doses of blood pressure medicine. Doing this puts you at risk for problems and can make the medicine less effective. Ask your health care provider about side effects or reactions to medicines  that you should  watch for. Contact a health care provider if you: Think you are having a reaction to a medicine you are taking. Have headaches that keep coming back (recurring). Feel dizzy. Have swelling in your ankles. Have trouble with your vision. Get help right away if you: Develop a severe headache or confusion. Have unusual weakness or numbness. Feel faint. Have severe pain in your chest or abdomen. Vomit repeatedly. Have trouble breathing. These symptoms may be an emergency. Get help right away. Call 911. Do not wait to see if the symptoms will go away. Do not drive yourself to the hospital. Summary Hypertension is when the force of blood pumping through your arteries is too strong. If this condition is not controlled, it may put you at risk for serious complications. Your personal target blood pressure may vary depending on your medical conditions, your age, and other factors. For most people, a normal blood pressure is less than 120/80. Hypertension is treated with lifestyle changes, medicines, or a combination of both. Lifestyle changes include losing weight, eating a healthy, low-sodium diet, exercising more, and limiting alcohol. This information is not intended to replace advice given to you by your health care provider. Make sure you discuss any questions you have with your health care provider. Document Revised: 01/14/2021 Document Reviewed: 01/14/2021 Elsevier Patient Education  2024 [employer]     Edwina Barth, MD [facility]

## 2023-05-19 NOTE — Assessment & Plan Note (Signed)
 Uncontrolled hypertension Has been taking amlodipine and nebivolol Not taking valsartan HCT at present time Cardiovascular risks associated with uncontrolled hypertension discussed Dietary approaches to stop hypertension discussed Benefits of exercise discussed Recommend to continue amlodipine 10 mg daily and restart valsartan HCT 320-12.5 mg daily Advised to monitor blood pressure readings at home several times a day for the next several weeks and keep a log.  Advised to contact the office if numbers persistently abnormal Follow-up in 3 months, earlier as needed.

## 2023-05-20 ENCOUNTER — Other Ambulatory Visit: Payer: Self-pay | Admitting: Radiology

## 2023-05-20 ENCOUNTER — Other Ambulatory Visit: Payer: Self-pay | Admitting: Emergency Medicine

## 2023-05-20 MED ORDER — SILDENAFIL CITRATE 20 MG PO TABS: 60.0000 mg | ORAL_TABLET | ORAL | 1 refills | Status: DC | PRN

## 2023-05-24 ENCOUNTER — Other Ambulatory Visit (HOSPITAL_COMMUNITY): Payer: Self-pay

## 2023-05-24 ENCOUNTER — Telehealth: Payer: Self-pay

## 2023-05-24 NOTE — Telephone Encounter (Signed)
 PA request has been Started. A Prior Authorization was initiated for this patients SILDENAFIL through CoverMyMeds.   Key: Maurilio Lovely

## 2023-05-25 ENCOUNTER — Other Ambulatory Visit (HOSPITAL_COMMUNITY): Payer: Self-pay

## 2023-05-25 NOTE — Telephone Encounter (Signed)
 Called pharmacy and they stated pt does not need PA PT COST FROM PHARMACY IS $9.00   PLEASES BE ADVISED

## 2023-05-26 ENCOUNTER — Other Ambulatory Visit (HOSPITAL_COMMUNITY): Payer: Self-pay

## 2023-06-01 ENCOUNTER — Other Ambulatory Visit: Payer: Self-pay | Admitting: Radiology

## 2023-06-01 MED ORDER — TIRZEPATIDE-WEIGHT MANAGEMENT 5 MG/0.5ML ~~LOC~~ SOLN: 5.0000 mg | SUBCUTANEOUS | 7 refills | Status: DC

## 2023-06-01 NOTE — Telephone Encounter (Signed)
 Okay to move up in dose.  Thanks

## 2023-06-03 ENCOUNTER — Telehealth: Payer: Self-pay

## 2023-06-03 NOTE — Telephone Encounter (Signed)
 Pharmacy Patient Advocate Encounter   Received notification from Onbase that prior authorization for Zepbound 5MG /0.5ML solution is required/requested.   Insurance verification completed.   The patient is insured through [facility] .   Per test claim: PA required; PA submitted to above mentioned insurance via CoverMyMeds Key/confirmation #/EOC [id number] Status is pending

## 2023-06-04 ENCOUNTER — Other Ambulatory Visit (HOSPITAL_COMMUNITY): Payer: Self-pay

## 2023-06-04 NOTE — Telephone Encounter (Signed)
 Pharmacy Patient Advocate Encounter  Received notification from [facility] that Prior Authorization for Zepbound 5MG /0.5ML solution has been APPROVED from 06/03/23 to 12/04/23. Unable to obtain price due to refill too soon rejection, last fill date 06/04/23 next available fill date04/05/25   PA #/Case ID/Reference #: [id number]

## 2023-06-19 ENCOUNTER — Other Ambulatory Visit: Payer: Self-pay | Admitting: Cardiovascular Disease

## 2023-06-24 NOTE — Telephone Encounter (Signed)
 Take total of 100 mg

## 2023-07-08 NOTE — Telephone Encounter (Signed)
 Continue 5 mg/week injections.  Thanks.

## 2023-07-13 ENCOUNTER — Other Ambulatory Visit: Payer: Self-pay | Admitting: Emergency Medicine

## 2023-07-13 NOTE — Telephone Encounter (Signed)
 Copied from CRM [id number]. Topic: Clinical - Medication Refill >> Jul 13, 2023  3:41 PM Ovid Blow wrote: Most Recent Primary Care Visit:  Provider: Elvira Hammersmith  Department: [facility]  Visit Type: OFFICE VISIT  Date: 05/19/2023  Medication: ZEPBOUND 2.5 MG/0.5ML Pen  Has the patient contacted their pharmacy? Yes (Agent: If no, request that the patient contact the pharmacy for the refill. If patient does not wish to contact the pharmacy document the reason why and proceed with request.) (Agent: If yes, when and what did the pharmacy advise?)  Is this the correct pharmacy for this prescription? Yes If no, delete pharmacy and type the correct one.  This is the patient's preferred pharmacy:  [id number] [place], [place] - [address] [place] [address] Phone: [phone] Fax: [phone]  [id number] [place],  - [address] [place] [address] Phone: [phone] Fax: [phone]   Has the prescription been filled recently? No  Is the patient out of the medication? Yes  Has the patient been seen for an appointment in the last year OR does the patient have an upcoming appointment? Yes  Can we respond through MyChart? Yes  Agent: Please be advised that Rx refills may take up to 3 business days. We ask that you follow-up with your pharmacy.

## 2023-07-15 ENCOUNTER — Other Ambulatory Visit: Payer: Self-pay | Admitting: Radiology

## 2023-07-15 MED ORDER — TIRZEPATIDE-WEIGHT MANAGEMENT 7.5 MG/0.5ML ~~LOC~~ SOLN: 7.5000 mg | SUBCUTANEOUS | 0 refills | Status: DC

## 2023-07-15 NOTE — Telephone Encounter (Signed)
 Spoke with patient and new rx has been sent

## 2023-08-09 ENCOUNTER — Other Ambulatory Visit: Payer: Self-pay | Admitting: Emergency Medicine

## 2023-08-24 ENCOUNTER — Ambulatory Visit: Payer: 59 | Admitting: Emergency Medicine

## 2023-08-25 ENCOUNTER — Ambulatory Visit: Admitting: Emergency Medicine

## 2023-09-08 ENCOUNTER — Encounter: Payer: Self-pay | Admitting: Emergency Medicine

## 2023-09-10 ENCOUNTER — Other Ambulatory Visit: Payer: Self-pay | Admitting: Radiology

## 2023-09-10 MED ORDER — ZEPBOUND 7.5 MG/0.5ML ~~LOC~~ SOAJ: 7.5000 mg | SUBCUTANEOUS | 0 refills | Status: DC

## 2023-09-22 ENCOUNTER — Telehealth: Payer: Self-pay | Admitting: Emergency Medicine

## 2023-09-22 ENCOUNTER — Other Ambulatory Visit: Payer: Self-pay | Admitting: Radiology

## 2023-09-22 MED ORDER — TIRZEPATIDE-WEIGHT MANAGEMENT 10 MG/0.5ML ~~LOC~~ SOLN: 10.0000 mg | SUBCUTANEOUS | 1 refills | Status: DC

## 2023-09-22 NOTE — Telephone Encounter (Signed)
 Okay to increase dose to 10 mg weekly

## 2023-09-22 NOTE — Telephone Encounter (Unsigned)
 Copied from CRM [id number]. Topic: Clinical - Medication Refill >> Sep 22, 2023  9:35 AM Suzen RAMAN wrote: Medication: tirzepatide  (ZEPBOUND ) 10 MG/0.5ML Pen  Has the patient contacted their pharmacy? Yes  This is the patient's preferred pharmacy:  [id number] [place], [place] - [address] [facility] [place] [address] Phone: [phone] Fax: [phone]  Is this the correct pharmacy for this prescription? Yes If no, delete pharmacy and type the correct one.   Has the prescription been filled recently? No  Is the patient out of the medication? No  Has the patient been seen for an appointment in the last year OR does the patient have an upcoming appointment? Yes  Can we respond through MyChart? Yes  Agent: Please be advised that Rx refills may take up to 3 business days. We ask that you follow-up with your pharmacy.

## 2023-09-22 NOTE — Telephone Encounter (Signed)
 Pt is requesting increased dosage

## 2023-09-28 ENCOUNTER — Other Ambulatory Visit: Payer: Self-pay | Admitting: Radiology

## 2023-09-28 MED ORDER — TIRZEPATIDE-WEIGHT MANAGEMENT 10 MG/0.5ML ~~LOC~~ SOAJ: 10.0000 mg | SUBCUTANEOUS | 1 refills | Status: DC

## 2023-10-20 ENCOUNTER — Other Ambulatory Visit (HOSPITAL_COMMUNITY): Payer: Self-pay

## 2023-10-20 ENCOUNTER — Telehealth: Payer: Self-pay

## 2023-10-20 ENCOUNTER — Other Ambulatory Visit: Payer: Self-pay | Admitting: Emergency Medicine

## 2023-10-20 NOTE — Telephone Encounter (Signed)
 Pharmacy Patient Advocate Encounter   Received notification from CoverMyMeds that prior authorization for Sildenafil  Citrate (PAH) 20MG  tablets  is required/requested.   Insurance verification completed.   The patient is insured through [facility] [place] .   Per test claim: PA required; PA started via CoverMyMeds. KEY [id number] . Waiting for clinical questions to populate.

## 2023-10-21 NOTE — Telephone Encounter (Signed)
 Pharmacy Patient Advocate Encounter  Received notification from [facility] that Prior Authorization for Sildenafil  Citrate (PAH) 20MG  tablets  has been DENIED.  See denial reason below. No denial letter attached in CMM. Will attach denial letter to Media tab once received.   PA #/Case ID/Reference #:  [id number] Denied. The requested drug is not approved by the Food and Drug Administration (FDA) for the treatment of ESSENTIAL PRIMARY HYPERTENSION in members 58 years of age or older. It is FDA approved for pulmonary arterial hypertension in members 79 years of age or older.

## 2023-10-21 NOTE — Telephone Encounter (Signed)
 PLEASE BE ADVISED Clinical questions have been answered and PA submitted.TO PLAN. PA currently Pending.

## 2023-10-26 ENCOUNTER — Other Ambulatory Visit (HOSPITAL_COMMUNITY): Payer: Self-pay

## 2023-10-26 ENCOUNTER — Telehealth: Payer: Self-pay | Admitting: Pharmacy Technician

## 2023-10-26 NOTE — Telephone Encounter (Signed)
 Pharmacy Patient Advocate Encounter   Received notification from CoverMyMeds that prior authorization for Zepbound  10MG /0.5ML pen-injectors is required/requested.   Insurance verification completed.   The patient is insured through [facility] .   Per test claim: PA required; PA submitted to above mentioned insurance via CoverMyMeds Key/confirmation #/EOC [id number] Status is pending

## 2023-10-26 NOTE — Telephone Encounter (Signed)
 Pharmacy Patient Advocate Encounter  Received notification from [employer] that Prior Authorization for Zepbound  10MG /0.5ML pen-injectors has been APPROVED from 10/26/23 to 10/25/24. Ran test claim, Copay is $24.99. This test claim was processed through [facility]- copay amounts may vary at other pharmacies due to pharmacy/plan contracts, or as the patient moves through the different stages of their insurance plan.   PA #/Case ID/Reference #: [id number]

## 2023-11-14 ENCOUNTER — Emergency Department (HOSPITAL_BASED_OUTPATIENT_CLINIC_OR_DEPARTMENT_OTHER)
Admission: EM | Admit: 2023-11-14 | Discharge: 2023-11-14 | Disposition: A | Discharge status: Home / Self Care | Attending: Emergency Medicine | Admitting: Emergency Medicine

## 2023-11-14 ENCOUNTER — Encounter (HOSPITAL_BASED_OUTPATIENT_CLINIC_OR_DEPARTMENT_OTHER): Payer: Self-pay | Admitting: Emergency Medicine

## 2023-11-14 ENCOUNTER — Other Ambulatory Visit: Payer: Self-pay

## 2023-11-14 DIAGNOSIS — Z79899 Other long term (current) drug therapy: Secondary | ICD-10-CM | POA: Diagnosis not present

## 2023-11-14 DIAGNOSIS — R519 Headache, unspecified: Secondary | ICD-10-CM | POA: Diagnosis present

## 2023-11-14 DIAGNOSIS — I10 Essential (primary) hypertension: Secondary | ICD-10-CM | POA: Diagnosis not present

## 2023-11-14 NOTE — ED Notes (Addendum)
 Pt presents with elevated BP - Hx: of htn. Takes BP medication as prescribed (most of the time) Amlodipine , losartan  . Came for evaluation because he does not feel like hisself. Denies headache currently ./ compliant of headache earlier. Denies blurred vision, Shob, or CP.

## 2023-11-14 NOTE — ED Triage Notes (Signed)
 Pt c/o HA for a couple of days; came here because he thinks BP might be elevated; takes HTN meds as directed; had salted cashews earlier

## 2023-11-14 NOTE — ED Provider Notes (Signed)
 [place] [facility] Provider Note   CSN: [id number] Arrival date & time: 11/14/23  1844     Patient presents with: Hypertension   Antonio Rodgers is a 43 y.o. male.    Hypertension  Patient with history of hypertension.  On amlodipine  and Diovan  HCT.  Blood pressure has been elevated.  Will get headache at times.  No headache at this time.  Saw PCP back in January and had increasing of his medication at that time.  Has not had blood pressure really checked since.  Did check it now and it was elevated.  No numbness weakness.  No confusion.  Has actually been losing weight with his Zepbound .  States he has been active and feeling good overall.    Past Medical History:  Diagnosis Date   Hypertension     Prior to Admission medications   Medication Sig Start Date End Date Taking? Authorizing Provider  amLODipine  (NORVASC ) 10 MG tablet TAKE 1 TABLET BY MOUTH EVERY DAY 10/07/22   Purcell Emil Schanz, MD  azelastine  (OPTIVAR ) 0.05 % ophthalmic solution INSTILL 1 DROP INTO BOTH EYES TWICE A DAY 04/21/23   Sagardia, Miguel Jose, MD  rosuvastatin  (CRESTOR ) 20 MG tablet TAKE 1 TABLET BY MOUTH EVERY DAY 10/07/22   Purcell Emil Schanz, MD  sildenafil  (REVATIO ) 20 MG tablet TAKE 3 TABLETS (60 MG TOTAL) BY MOUTH AS NEEDED. 10/20/23   Sagardia, Miguel Jose, MD  tirzepatide  (ZEPBOUND ) 10 MG/0.5ML Pen Inject 10 mg into the skin once a week. 09/28/23   Sagardia, Miguel Jose, MD  tirzepatide  (ZEPBOUND ) 7.5 MG/0.5ML Pen Inject 7.5 mg into the skin once a week. 09/10/23   Purcell Emil Schanz, MD  tirzepatide  10 MG/0.5ML injection vial Inject 10 mg into the skin once a week. 09/22/23   Sagardia, Miguel Jose, MD  tirzepatide  5 MG/0.5ML injection vial Inject 5 mg into the skin once a week. 06/01/23   Purcell Emil Schanz, MD  valsartan -hydrochlorothiazide  (DIOVAN -HCT) 320-12.5 MG tablet Take 1 tablet by mouth daily. 05/19/23   Purcell Emil Schanz, MD  ZEPBOUND  2.5 MG/0.5ML  Pen Inject 2.5 mg into the skin once a week. 04/27/23   [provider]    Allergies: Other    Review of Systems  Updated Vital Signs BP (!) 177/104   Pulse 73   Temp 98.2 F (36.8 C) (Oral)   Resp (!) 21   Ht 5' 9 (1.753 m)   Wt 117 kg   SpO2 99%   BMI 38.10 kg/m   Physical Exam Vitals and nursing note reviewed.  Cardiovascular:     Rate and Rhythm: Regular rhythm.  Abdominal:     Tenderness: There is no abdominal tenderness.  Musculoskeletal:     Cervical back: Neck supple.  Skin:    Capillary Refill: Capillary refill takes less than 2 seconds.  Neurological:     Mental Status: He is alert and oriented to person, place, and time.     (all labs ordered are listed, but only abnormal results are displayed) Labs Reviewed - No data to display  EKG: None  Radiology: No results found.   Procedures   Medications Ordered in the ED - No data to display                                  Medical Decision Making  Patient with high blood pressure.  It is elevated here.  However  reviewing notes it was elevated also in January.  Had increased medication at that time.  It is on high dose of both the Diovan  HCT and the amlodipine .  Already on 2 high agents do not think I am going to start a third at this time.  Will call his PCP tomorrow and see about him getting in to be seen or starting new medicine.  Doubt acute endorgan damage at this time.  Appears stable for discharge home.     Final diagnoses:  Hypertension, unspecified type    ED Discharge Orders     None          Patsey Lot, MD 11/14/23 2303

## 2023-11-14 NOTE — Discharge Instructions (Addendum)
 Your blood pressure does appear somewhat poorly controlled at this time.  However I do doubt endorgan damage.  Follow-up with your doctor.  Give a call for short-term follow-up

## 2023-11-15 ENCOUNTER — Ambulatory Visit: Admitting: Emergency Medicine

## 2023-12-02 ENCOUNTER — Other Ambulatory Visit: Payer: Self-pay | Admitting: Emergency Medicine

## 2023-12-02 MED ORDER — AMLODIPINE BESYLATE 10 MG PO TABS: 10.0000 mg | ORAL_TABLET | Freq: Every day | ORAL | 1 refills | Status: DC

## 2023-12-02 NOTE — Telephone Encounter (Signed)
 Copied from CRM [id number]. Topic: Clinical - Medication Refill >> Dec 02, 2023  8:16 AM Pinkey ORN wrote: Medication: amLODipine  (NORVASC ) 10 MG tablet  Has the patient contacted their pharmacy? Yes (Agent: If no, request that the patient contact the pharmacy for the refill. If patient does not wish to contact the pharmacy document the reason why and proceed with request.) (Agent: If yes, when and what did the pharmacy advise?)  This is the patient's preferred pharmacy:  [id number] [place], [place] - [address] [place] [address] Phone: [phone] Fax: [phone]  Is this the correct pharmacy for this prescription? Yes If no, delete pharmacy and type the correct one.   Has the prescription been filled recently? No  Is the patient out of the medication? Yes  Has the patient been seen for an appointment in the last year OR does the patient have an upcoming appointment? Yes  Can we respond through MyChart? Yes  Agent: Please be advised that Rx refills may take up to 3 business days. We ask that you follow-up with your pharmacy.

## 2023-12-09 ENCOUNTER — Telehealth: Payer: Self-pay | Admitting: Radiology

## 2023-12-09 NOTE — Telephone Encounter (Signed)
 Copied from CRM [id number]. Topic: Clinical - Medication Question >> Dec 09, 2023  8:02 AM Ahlexyia S wrote: Reason for CRM: Pt called stating he would like to decrease the dosage of medication  tirzepatide  (ZEPBOUND ) 10 MG/0.5ML Pen back to 7.5mg . Pt stated the current dosage is making him feel bad, he said he has stopped taking it and feels much better. Pt also mentioned that he didn't feel that way when taking the 7.5mg .

## 2023-12-10 ENCOUNTER — Other Ambulatory Visit: Payer: Self-pay | Admitting: Radiology

## 2023-12-10 MED ORDER — ZEPBOUND 7.5 MG/0.5ML ~~LOC~~ SOAJ: 7.5000 mg | SUBCUTANEOUS | 0 refills | Status: DC

## 2023-12-10 MED ORDER — AMLODIPINE BESYLATE 10 MG PO TABS: 10.0000 mg | ORAL_TABLET | Freq: Every day | ORAL | 1 refills | Status: AC

## 2023-12-27 ENCOUNTER — Ambulatory Visit: Admitting: Emergency Medicine

## 2023-12-27 ENCOUNTER — Encounter: Payer: Self-pay | Admitting: Emergency Medicine

## 2023-12-27 ENCOUNTER — Ambulatory Visit (INDEPENDENT_AMBULATORY_CARE_PROVIDER_SITE_OTHER): Admitting: Emergency Medicine

## 2023-12-27 VITALS — BP 158/100 | HR 91 | Temp 98.5°F | Ht 69.0 in | Wt 259.0 lb

## 2023-12-27 DIAGNOSIS — B029 Zoster without complications: Secondary | ICD-10-CM | POA: Diagnosis not present

## 2023-12-27 DIAGNOSIS — I10 Essential (primary) hypertension: Secondary | ICD-10-CM

## 2023-12-27 MED ORDER — VALACYCLOVIR HCL 1 G PO TABS: 1000.0000 mg | ORAL_TABLET | Freq: Two times a day (BID) | ORAL | 1 refills | Status: AC

## 2023-12-27 MED ORDER — ZEPBOUND 7.5 MG/0.5ML ~~LOC~~ SOAJ: 7.5000 mg | SUBCUTANEOUS | 5 refills | Status: AC

## 2023-12-27 NOTE — Progress Notes (Signed)
 Antonio Rodgers 43 y.o.   Chief Complaint  Patient presents with   Rash    Patient here for cluster of break outs on side of leg and back. Started last Wednesday, he states its itchy a little tingling.     HISTORY OF PRESENT ILLNESS: This is a 43 y.o. male complaining of itchy rash to lateral side of upper left leg since Friday.  Was having pins-and-needles tingling sensation in the same area 2 days earlier No other associated symptoms No other complaints or medical concerns today.  Rash Pertinent negatives include no congestion, cough, diarrhea, fever, shortness of breath, sore throat or vomiting.     Prior to Admission medications   Medication Sig Start Date End Date Taking? Authorizing Provider  amLODipine  (NORVASC ) 10 MG tablet Take 1 tablet (10 mg total) by mouth daily. 12/10/23  Yes Alaiyah Bollman, Emil Schanz, MD  azelastine  (OPTIVAR ) 0.05 % ophthalmic solution INSTILL 1 DROP INTO BOTH EYES TWICE A DAY 04/21/23  Yes Reha Martinovich, Emil Schanz, MD  sildenafil  (REVATIO ) 20 MG tablet TAKE 3 TABLETS (60 MG TOTAL) BY MOUTH AS NEEDED. 10/20/23  Yes Caryl Fate, Emil Schanz, MD  tirzepatide  (ZEPBOUND ) 7.5 MG/0.5ML Pen Inject 7.5 mg into the skin once a week. 12/10/23  Yes Dyamond Tolosa, Emil Schanz, MD  valsartan -hydrochlorothiazide  (DIOVAN -HCT) 320-12.5 MG tablet Take 1 tablet by mouth daily. 05/19/23  Yes Mistina Coatney, Emil Schanz, MD  rosuvastatin  (CRESTOR ) 20 MG tablet TAKE 1 TABLET BY MOUTH EVERY DAY Patient not taking: Reported on 12/27/2023 10/07/22   Audryna Wendt Jose, MD  tirzepatide  (ZEPBOUND ) 10 MG/0.5ML Pen INJECT 10 MG INTO THE SKIN ONE TIME PER WEEK Patient not taking: Reported on 12/27/2023 12/02/23   Rainelle Sulewski Jose, MD  tirzepatide  10 MG/0.5ML injection vial Inject 10 mg into the skin once a week. Patient not taking: Reported on 12/27/2023 09/22/23   Demauri Advincula Jose, MD  tirzepatide  5 MG/0.5ML injection vial Inject 5 mg into the skin once a week. Patient not taking: Reported on 12/27/2023  06/01/23   Khadeem Rockett Jose, MD  ZEPBOUND  2.5 MG/0.5ML Pen Inject 2.5 mg into the skin once a week. Patient not taking: Reported on 12/27/2023 04/27/23   [provider]    Allergies  Allergen Reactions   Other Other (See Comments)    Family history of malignant hyperthermia under general anesthesia; pt himself has not undergone general anesthesia    Patient Active Problem List   Diagnosis Date Noted   Morbid obesity (HCC) 05/19/2023   Essential hypertension 11/18/2014    Past Medical History:  Diagnosis Date   Hypertension     Past Surgical History:  Procedure Laterality Date   KNEE SURGERY      Social History   Socioeconomic History   Marital status: Married    Spouse name: Not on file   Number of children: Not on file   Years of education: Not on file   Highest education level: Not on file  Occupational History   Not on file  Tobacco Use   Smoking status: Never   Smokeless tobacco: Never  Vaping Use   Vaping status: Never Used  Substance and Sexual Activity   Alcohol use: Yes    Alcohol/week: 0.0 standard drinks of alcohol    Comment: Weekends   Drug use: No   Sexual activity: Not on file  Other Topics Concern   Not on file  Social History Narrative   Not on file   Social Drivers of Health   Financial Resource Strain: Not  on file  Food Insecurity: Not on file  Transportation Needs: Not on file  Physical Activity: Not on file  Stress: Not on file  Social Connections: Unknown (09/13/2021)   Received from [facility]   Social Network    Social Network: Not on file  Intimate Partner Violence: Unknown (09/13/2021)   Received from [facility]   HITS    Physically Hurt: Not on file    Insult or Talk Down To: Not on file    Threaten Physical Harm: Not on file    Scream or Curse: Not on file    History reviewed. No pertinent family history.   Review of Systems  Constitutional: Negative.  Negative for chills and fever.  HENT:  Negative.  Negative for congestion and sore throat.   Respiratory: Negative.  Negative for cough and shortness of breath.   Cardiovascular: Negative.  Negative for chest pain.  Gastrointestinal:  Negative for abdominal pain, diarrhea, nausea and vomiting.  Genitourinary: Negative.  Negative for dysuria and hematuria.  Skin:  Positive for itching and rash.  Neurological: Negative.  Negative for dizziness and headaches.    Vitals:   12/27/23 1346  BP: (!) 158/100  Pulse: 91  Temp: 98.5 F (36.9 C)  SpO2: 97%    Physical Exam Vitals reviewed.  Constitutional:      Appearance: Normal appearance.  HENT:     Head: Normocephalic.  Eyes:     Extraocular Movements: Extraocular movements intact.  Cardiovascular:     Rate and Rhythm: Normal rate.  Pulmonary:     Effort: Pulmonary effort is normal.  Musculoskeletal:     Comments: Vesicular rash in dermatomal distribution of L2-3  Skin:    General: Skin is warm and dry.     Findings: Rash present.  Neurological:     General: No focal deficit present.     Mental Status: He is alert and oriented to person, place, and time.  Psychiatric:        Mood and Affect: Mood normal.        Behavior: Behavior normal.      ASSESSMENT & PLAN: Problem List Items Addressed This Visit       Cardiovascular and Mediastinum   Essential hypertension   BP Readings from Last 3 Encounters:  12/27/23 (!) 158/100  11/14/23 (!) 177/104  05/19/23 (!) 168/112  Advised to monitor blood pressure readings at home several times a day for the next several weeks and keep a log. Advised to contact the office if numbers persistently abnormal  Continue valsartan  HCT 320-12.5 mg daily along with amlodipine  10 mg daily.  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 Dermatome  distribution of L2-3 Recommend to start valacyclovir 1000 mg twice a day for 7 to 10 days Symptom management discussed Advised to contact the office if no better or worse during the next several days.      Relevant Medications   valACYclovir (VALTREX) 1000 MG tablet     Other   Morbid obesity (HCC)   Diet and nutrition discussed Cardiovascular risk associated with obesity discussed Benefits of exercise discussed Advised to decrease amount of daily carbohydrate intake and daily calories and increase amount of plant-based protein in his diet Recently had dose of Zepbound  increased to 10 mg but not tolerating side effects Was doing  very well on 7.5 mg Recommend to continue 7.5 mg Zepbound  weekly New prescription sent to pharmacy of record today.      Relevant Medications   tirzepatide  (ZEPBOUND ) 7.5 MG/0.5ML Pen   Patient Instructions  Shingles  Shingles, or herpes zoster, is an infection. It gives you a skin rash and blisters. These infected areas may hurt a lot. Shingles only happens if: You've had chickenpox. You've been given a shot called a vaccine to protect you from getting chickenpox. Shingles is rare in this case. What are the causes? Shingles is caused by a germ called the varicella-zoster virus. This is the same germ that causes chickenpox. After you're exposed to the germ, it stays in your body but is dormant. This means it isn't active. Shingles happens if the germ becomes active again. This can happen years after you're first exposed to the germ. What increases the risk? You may be more likely to get shingles if: You're older than 44 years of age. You're under a lot of stress. You have a weak immune system. The immune system is your body's defense system. It may be weak if: You have human immunodeficiency virus (HIV). You have acquired immunodeficiency syndrome (AIDS). You have cancer. You take medicines that weaken your immune system. These include organ  transplant medicines. What are the signs or symptoms? The first symptoms of shingles may be itching, tingling, or pain. Your skin may feel like it's burning. A few days or weeks later, you'll get a rash. Here's what you can expect: The rash is likely to be on one side of your body. The rash may be shaped like a belt or a band. Over time, it will turn into blisters filled with fluid. The blisters will break open and change into scabs. The scabs will dry up in about 2-3 weeks. You may also have: A fever. Chills. A headache. Nausea. How is this diagnosed? Shingles is diagnosed with a skin exam. A sample called a culture may be taken from one of your blisters and sent to a lab. This will show if you have shingles. How is this treated? The rash may last for several weeks. There's no cure for shingles, but your health care provider may give you medicines. These medicines may: Help with pain. Help with itching. Help with irritation and swelling. Help you get better sooner. Help to prevent long-term problems. If the rash is on your face, you may need to see an eye doctor or an ear, nose, and throat (ENT) doctor. Follow these instructions at home: Medicines Take your medicines only as told by your provider. Put an anti-itch cream or numbing cream on the rash or blisters as told by your provider. Relieving itching and discomfort  To help with itching: Put cold, wet cloths called cold compresses on the rash or blisters. Take a cool bath. Try adding baking soda or dry oatmeal to the water. Do not bathe in hot water. Use calamine lotion on the rash or blisters. You can get this type of lotion at the store. Blister and rash care Keep your rash covered with a loose bandage. Wear loose clothes that don't rub on your rash. Take care of your rash as told by your provider. Make sure you: Wash your hands with soap and water for at least 20 seconds before and after you change your bandage. If you  can't use soap and water, use hand sanitizer. Keep your rash and blisters clean by washing them with mild soap and  cool water. Change your bandage. Check your rash every day for signs of infection. Check for: More redness, swelling, or pain. Fluid or blood. Warmth. Pus or a bad smell. Do not scratch your rash. Do not pick at your blisters. To help you not scratch: Keep your fingernails clean and cut short. Try to wear gloves or mittens when you sleep. General instructions Rest. Wash your hands often with soap and water for at least 20 seconds. If you can't use soap and water, use hand sanitizer. Washing your hands lowers your chance of getting a skin infection. Your infection can cause chickenpox in others. If you have blisters that aren't scabs yet, stay away from: Babies. Pregnant people. Children who have eczema. Older people who have organ transplants. People who have a long-term, or chronic, illness. Anyone who hasn't had chickenpox before. Anyone who hasn't gotten the chickenpox vaccine. How is this prevented? Vaccines are the best way to prevent you from getting chickenpox or shingles. Talk with your provider about getting these shots. Where to find more information Centers for Disease Control and Prevention (CDC): TonerPromos.no Contact a health care provider if: Your pain doesn't get better with medicine. Your pain doesn't get better after the rash heals. You have any signs of infection around the rash. Your rash or blisters get worse. You have a fever or chills. Get help right away if: The rash is on your face or nose. You have pain in your face or by your eye. You lose feeling on one side of your face. You have trouble seeing. You have ear pain or ringing in your ear. This information is not intended to replace advice given to you by your health care provider. Make sure you discuss any questions you have with your health care provider. Document Revised: 12/10/2022 Document  Reviewed: 04/24/2022 Elsevier Patient Education  2024 Elsevier Inc.    Emil Schaumann, MD [facility]

## 2023-12-27 NOTE — Assessment & Plan Note (Signed)
 Diet and nutrition discussed Cardiovascular risk associated with obesity discussed Benefits of exercise discussed Advised to decrease amount of daily carbohydrate intake and daily calories and increase amount of plant-based protein in his diet Recently had dose of Zepbound  increased to 10 mg but not tolerating side effects Was doing very well on 7.5 mg Recommend to continue 7.5 mg Zepbound  weekly New prescription sent to pharmacy of record today.

## 2023-12-27 NOTE — Patient Instructions (Signed)
 Shingles  Shingles, or herpes zoster, is an infection. It gives you a skin rash and blisters. These infected areas may hurt a lot. Shingles only happens if: You've had chickenpox. You've been given a shot called a vaccine to protect you from getting chickenpox. Shingles is rare in this case. What are the causes? Shingles is caused by a germ called the varicella-zoster virus. This is the same germ that causes chickenpox. After you're exposed to the germ, it stays in your body but is dormant. This means it isn't active. Shingles happens if the germ becomes active again. This can happen years after you're first exposed to the germ. What increases the risk? You may be more likely to get shingles if: You're older than 44 years of age. You're under a lot of stress. You have a weak immune system. The immune system is your body's defense system. It may be weak if: You have human immunodeficiency virus (HIV). You have acquired immunodeficiency syndrome (AIDS). You have cancer. You take medicines that weaken your immune system. These include organ transplant medicines. What are the signs or symptoms? The first symptoms of shingles may be itching, tingling, or pain. Your skin may feel like it's burning. A few days or weeks later, you'll get a rash. Here's what you can expect: The rash is likely to be on one side of your body. The rash may be shaped like a belt or a band. Over time, it will turn into blisters filled with fluid. The blisters will break open and change into scabs. The scabs will dry up in about 2-3 weeks. You may also have: A fever. Chills. A headache. Nausea. How is this diagnosed? Shingles is diagnosed with a skin exam. A sample called a culture may be taken from one of your blisters and sent to a lab. This will show if you have shingles. How is this treated? The rash may last for several weeks. There's no cure for shingles, but your health care provider may give you medicines.  These medicines may: Help with pain. Help with itching. Help with irritation and swelling. Help you get better sooner. Help to prevent long-term problems. If the rash is on your face, you may need to see an eye doctor or an ear, nose, and throat (ENT) doctor. Follow these instructions at home: Medicines Take your medicines only as told by your provider. Put an anti-itch cream or numbing cream on the rash or blisters as told by your provider. Relieving itching and discomfort  To help with itching: Put cold, wet cloths called cold compresses on the rash or blisters. Take a cool bath. Try adding baking soda or dry oatmeal to the water. Do not bathe in hot water. Use calamine lotion on the rash or blisters. You can get this type of lotion at the store. Blister and rash care Keep your rash covered with a loose bandage. Wear loose clothes that don't rub on your rash. Take care of your rash as told by your provider. Make sure you: Wash your hands with soap and water for at least 20 seconds before and after you change your bandage. If you can't use soap and water, use hand sanitizer. Keep your rash and blisters clean by washing them with mild soap and cool water. Change your bandage. Check your rash every day for signs of infection. Check for: More redness, swelling, or pain. Fluid or blood. Warmth. Pus or a bad smell. Do not scratch your rash. Do not pick at your  blisters. To help you not scratch: Keep your fingernails clean and cut short. Try to wear gloves or mittens when you sleep. General instructions Rest. Wash your hands often with soap and water for at least 20 seconds. If you can't use soap and water, use hand sanitizer. Washing your hands lowers your chance of getting a skin infection. Your infection can cause chickenpox in others. If you have blisters that aren't scabs yet, stay away from: Babies. Pregnant people. Children who have eczema. Older people who have organ  transplants. People who have a long-term, or chronic, illness. Anyone who hasn't had chickenpox before. Anyone who hasn't gotten the chickenpox vaccine. How is this prevented? Vaccines are the best way to prevent you from getting chickenpox or shingles. Talk with your provider about getting these shots. Where to find more information Centers for Disease Control and Prevention (CDC): [employer]erProm[employer].no Contact a health care provider if: Your pain doesn't get better with medicine. Your pain doesn't get better after the rash heals. You have any signs of infection around the rash. Your rash or blisters get worse. You have a fever or chills. Get help right away if: The rash is on your face or nose. You have pain in your face or by your eye. You lose feeling on one side of your face. You have trouble seeing. You have ear pain or ringing in your ear. This information is not intended to replace advice given to you by your health care provider. Make sure you discuss any questions you have with your health care provider. Document Revised: 12/10/2022 Document Reviewed: 04/24/2022 Elsevier Patient Education  2024 [employer].

## 2023-12-27 NOTE — Assessment & Plan Note (Signed)
 BP Readings from Last 3 Encounters:  12/27/23 (!) 158/100  11/14/23 (!) 177/104  05/19/23 (!) 168/112  Advised to monitor blood pressure readings at home several times a day for the next several weeks and keep a log. Advised to contact the office if numbers persistently abnormal  Continue valsartan  HCT 320-12.5 mg daily along with amlodipine  10 mg daily.  Not taking it daily Cardiovascular risks associated with uncontrolled hypertension discussed May need thyroid medication

## 2023-12-27 NOTE — Assessment & Plan Note (Signed)
 Clinically stable. 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

## 2024-01-03 ENCOUNTER — Telehealth: Payer: Self-pay

## 2024-01-03 NOTE — Telephone Encounter (Signed)
 Copied from CRM #[id number]. Topic: Clinical - Medication Question >> Jan 03, 2024  7:51 AM Mesmerise C wrote: Reason for CRM: Patient inquiring when does he stop taking the valACYclovir (VALTREX) 1000 MG tablet and does he get the next refill he currently has one pill left

## 2024-01-04 NOTE — Telephone Encounter (Signed)
 Informed patient of provider message patient understood with no further questions

## 2024-01-04 NOTE — Telephone Encounter (Signed)
 Finish what is left and stop.

## 2024-02-25 ENCOUNTER — Other Ambulatory Visit: Payer: Self-pay | Admitting: Emergency Medicine

## 2024-02-25 NOTE — Telephone Encounter (Unsigned)
 Copied from CRM [id number]. Topic: Clinical - Medication Refill >> Feb 25, 2024  1:56 PM Mercedes MATSU wrote: Medication:  sildenafil  (REVATIO ) 20 MG tablet    Has the patient contacted their pharmacy? Yes (Agent: If no, request that the patient contact the pharmacy for the refill. If patient does not wish to contact the pharmacy document the reason why and proceed with request.) (Agent: If yes, when and what did the pharmacy advise?)  This is the patient's preferred pharmacy:  [facility] [phone] - [address] P: [phone]  Is this the correct pharmacy for this prescription? No If no, delete pharmacy and type the correct one.   Has the prescription been filled recently? Yes  Is the patient out of the medication? Yes  Has the patient been seen for an appointment in the last year OR does the patient have an upcoming appointment? Yes  Can we respond through MyChart? Yes  Agent: Please be advised that Rx refills may take up to 3 business days. We ask that you follow-up with your pharmacy.

## 2024-02-28 MED ORDER — SILDENAFIL CITRATE 20 MG PO TABS: 60.0000 mg | ORAL_TABLET | ORAL | 2 refills | Status: AC | PRN

## 2024-03-29 ENCOUNTER — Encounter: Payer: Self-pay | Admitting: Emergency Medicine

## 2024-03-30 MED ORDER — ZEPBOUND 5 MG/0.5ML ~~LOC~~ SOAJ: 5.0000 mg | SUBCUTANEOUS | 0 refills | Status: AC

## 2024-03-30 NOTE — Telephone Encounter (Signed)
 Copied from CRM #[id number]. Topic: Clinical - Medication Question >> Mar 30, 2024  9:18 AM Deleta RAMAN wrote: Reason for CRM: Hello  I haven't took my Zepbound  in about 3 weeks and I want to start back do I start back at 7.5 or do I need to start over.  Patient would like to lower dosage to about a 5 instead of 7.5 or whatever is needed to lower dosage. [phone]

## 2024-03-30 NOTE — Telephone Encounter (Signed)
Need to start over.

## 2024-03-30 NOTE — Telephone Encounter (Signed)
 Please advise

## 2024-03-30 NOTE — Telephone Encounter (Signed)
 Yes

## 2024-03-30 NOTE — Telephone Encounter (Signed)
 5mg  ok to start over?

## 2024-04-04 ENCOUNTER — Other Ambulatory Visit: Payer: Self-pay | Admitting: Emergency Medicine

## 2024-04-04 NOTE — Telephone Encounter (Signed)
 Needs PA

## 2024-04-11 ENCOUNTER — Telehealth: Payer: Self-pay

## 2024-04-11 NOTE — Telephone Encounter (Signed)
 Copied from CRM [id number]. Topic: Clinical - Medication Prior Auth >> Apr 04, 2024 11:10 AM Aleatha BROCKS wrote: Reason for CRM: Patient calling to confirm that P/A was sent for his zepbound  >> Apr 11, 2024  9:20 AM Joesph B wrote: Patient would like to know if his PA has been completed for zepbound . Please call patient.

## 2024-04-17 ENCOUNTER — Ambulatory Visit: Admitting: Emergency Medicine

## 2024-04-17 ENCOUNTER — Telehealth: Payer: Self-pay

## 2024-04-17 NOTE — Telephone Encounter (Signed)
 I have contacted the patient and LVM letting him know that we are needing to reschedule your appointment due to the inclement weather

## 2024-04-20 ENCOUNTER — Ambulatory Visit: Admitting: Emergency Medicine

## 2024-04-20 ENCOUNTER — Encounter: Payer: Self-pay | Admitting: Emergency Medicine

## 2024-04-20 VITALS — BP 180/112 | HR 76 | Temp 98.3°F | Ht 69.0 in | Wt 273.0 lb

## 2024-04-20 DIAGNOSIS — I10 Essential (primary) hypertension: Secondary | ICD-10-CM | POA: Diagnosis not present

## 2024-04-20 DIAGNOSIS — Z6841 Body Mass Index (BMI) 40.0 and over, adult: Secondary | ICD-10-CM | POA: Diagnosis not present

## 2024-04-20 LAB — CBC
HCT: 43.8 % (ref 39.0–52.0)
Hemoglobin: 14.9 g/dL (ref 13.0–17.0)
MCHC: 34.1 g/dL (ref 30.0–36.0)
MCV: 83.5 fl (ref 78.0–100.0)
Platelets: 276 10*3/uL (ref 150.0–400.0)
RBC: 5.25 Mil/uL (ref 4.22–5.81)
RDW: 13.4 % (ref 11.5–15.5)
WBC: 7 10*3/uL (ref 4.0–10.5)

## 2024-04-20 LAB — HEMOGLOBIN A1C: Hgb A1c MFr Bld: 5.7 % (ref 4.6–6.5)

## 2024-04-20 MED ORDER — NEBIVOLOL HCL 5 MG PO TABS: 5.0000 mg | ORAL_TABLET | Freq: Every day | ORAL | 3 refills | Status: AC

## 2024-04-20 NOTE — Assessment & Plan Note (Signed)
 BP Readings from Last 3 Encounters:  04/20/24 (!) 180/112  12/27/23 (!) 158/100  11/14/23 (!) 177/104  States good compliance with blood pressure medications Asymptomatic. Cardiovascular risks associated with uncontrolled hypertension discussed Diet and nutrition discussed Recommend to continue valsartan  HCT 320-12.5 mg daily and amlodipine  10 mg daily Recommend to start Bystolic  5 mg daily.  May need to increase dose depending on blood pressure readings at home.  Advised to take blood pressure reading at home daily for the next several weeks and contact the office if numbers persistently abnormal Recommend blood work today and follow-up in 4 weeks

## 2024-04-20 NOTE — Assessment & Plan Note (Signed)
 Diet and nutrition discussed Cardiovascular risk associated with obesity discussed Benefits of exercise discussed Advised to decrease amount of daily carbohydrate intake and daily calories and increase amount of plant-based protein in his diet Recently had dose of Zepbound  increased to 10 mg but not tolerating side effects Was doing very well on 7.5 Has been off Zepbound  for 4 weeks Recommend to restart at 5 mg weekly.  Patient has medication at home. Advised to avoid EtOH consumption while on GLP-1 agonist

## 2024-04-20 NOTE — Progress Notes (Signed)
 Antonio Rodgers 44 y.o.   Chief Complaint  Patient presents with   Follow-up    Pt also has questions about the zepbound      HISTORY OF PRESENT ILLNESS: This is a 44 y.o. male complaining of not feeling well It all started during Christmas time. Was taking Zepbound  but alcohol intake made him feel very bad Taking blood pressure medications daily.  Good compliance. Was off Zepbound  for about 4 weeks No other complaints or medical concerns today. Wt Readings from Last 3 Encounters:  04/20/24 273 lb (123.8 kg)  12/27/23 259 lb (117.5 kg)  11/14/23 258 lb (117 kg)   BP Readings from Last 3 Encounters:  04/20/24 (!) 180/112  12/27/23 (!) 158/100  11/14/23 (!) 177/104     HPI   Prior to Admission medications  Medication Sig Start Date End Date Taking? Authorizing Provider  amLODipine  (NORVASC ) 10 MG tablet Take 1 tablet (10 mg total) by mouth daily. 12/10/23  Yes Jericca Russett, Emil Schanz, MD  azelastine  (OPTIVAR ) 0.05 % ophthalmic solution INSTILL 1 DROP INTO BOTH EYES TWICE A DAY 04/21/23  Yes Damia Bobrowski, Emil Schanz, MD  sildenafil  (REVATIO ) 20 MG tablet Take 3 tablets (60 mg total) by mouth as needed. 02/28/24  Yes Gregroy Dombkowski, Emil Schanz, MD  tirzepatide  (ZEPBOUND ) 5 MG/0.5ML Pen Inject 5 mg into the skin once a week. 03/30/24  Yes Windsor Zirkelbach, Emil Schanz, MD  tirzepatide  (ZEPBOUND ) 7.5 MG/0.5ML Pen Inject 7.5 mg into the skin once a week. 12/27/23  Yes Nica Friske, Emil Schanz, MD  valsartan -hydrochlorothiazide  (DIOVAN -HCT) 320-12.5 MG tablet Take 1 tablet by mouth daily. 05/19/23  Yes Mrytle Bento, Emil Schanz, MD  rosuvastatin  (CRESTOR ) 20 MG tablet TAKE 1 TABLET BY MOUTH EVERY DAY Patient not taking: Reported on 04/20/2024 10/07/22   Turki Tapanes Jose, MD    Allergies[1]  Patient Active Problem List   Diagnosis Date Noted   Morbid obesity (HCC) 05/19/2023   Essential hypertension 11/18/2014    Past Medical History:  Diagnosis Date   Hypertension     Past Surgical History:   Procedure Laterality Date   KNEE SURGERY      Social History   Socioeconomic History   Marital status: Married    Spouse name: Not on file   Number of children: Not on file   Years of education: Not on file   Highest education level: Not on file  Occupational History   Not on file  Tobacco Use   Smoking status: Never   Smokeless tobacco: Never  Vaping Use   Vaping status: Never Used  Substance and Sexual Activity   Alcohol use: Yes    Alcohol/week: 0.0 standard drinks of alcohol    Comment: Weekends   Drug use: No   Sexual activity: Not on file  Other Topics Concern   Not on file  Social History Narrative   Not on file   Social Drivers of Health   Tobacco Use: Low Risk (04/20/2024)   Patient History    Smoking Tobacco Use: Never    Smokeless Tobacco Use: Never    Passive Exposure: Not on file  Financial Resource Strain: Not on file  Food Insecurity: Not on file  Transportation Needs: Not on file  Physical Activity: Not on file  Stress: Not on file  Social Connections: Unknown (09/13/2021)   Received from [facility]   Social Network    Social Network: Not on file  Intimate Partner Violence: Unknown (09/13/2021)   Received from [facility]   HITS  Physically Hurt: Not on file    Insult or Talk Down To: Not on file    Threaten Physical Harm: Not on file    Scream or Curse: Not on file  Depression (PHQ2-9): Low Risk (12/27/2023)   Depression (PHQ2-9)    PHQ-2 Score: 0  Alcohol Screen: Not on file  Housing: Not on file  Utilities: Not on file  Health Literacy: Not on file    History reviewed. No pertinent family history.   Review of Systems  Constitutional: Negative.  Negative for chills and fever.  HENT: Negative.  Negative for congestion and sore throat.   Respiratory: Negative.  Negative for cough and shortness of breath.   Cardiovascular: Negative.  Negative for chest pain and palpitations.  Gastrointestinal:  Negative  for abdominal pain, diarrhea, nausea and vomiting.  Genitourinary: Negative.  Negative for dysuria and hematuria.  Skin: Negative.  Negative for rash.  Neurological: Negative.  Negative for dizziness and headaches.  All other systems reviewed and are negative.   Vitals:   04/20/24 1551  BP: (!) 180/112  Pulse: 76  Temp: 98.3 F (36.8 C)  SpO2: 96%    Physical Exam Vitals reviewed.  HENT:     Head: Normocephalic.     Mouth/Throat:     Mouth: Mucous membranes are moist.     Pharynx: Oropharynx is clear.  Eyes:     Extraocular Movements: Extraocular movements intact.     Pupils: Pupils are equal, round, and reactive to light.  Cardiovascular:     Rate and Rhythm: Normal rate and regular rhythm.     Pulses: Normal pulses.     Heart sounds: Normal heart sounds.  Pulmonary:     Effort: Pulmonary effort is normal.     Breath sounds: Normal breath sounds.  Musculoskeletal:     Cervical back: No tenderness.  Lymphadenopathy:     Cervical: No cervical adenopathy.  Skin:    General: Skin is warm and dry.     Capillary Refill: Capillary refill takes less than 2 seconds.  Neurological:     General: No focal deficit present.     Mental Status: He is alert and oriented to person, place, and time.  Psychiatric:        Mood and Affect: Mood normal.        Behavior: Behavior normal.      ASSESSMENT & PLAN: A total of 42 minutes was spent with the patient and counseling/coordination of care regarding preparing for this visit, review of most recent office visit notes, diagnosis of hypertension and cardiovascular risks associated with this condition, review of all medications and changes made, review of most recent bloodwork results, review of health maintenance items, education on nutrition, prognosis, documentation, and need for follow up.  Problem List Items Addressed This Visit       Cardiovascular and Mediastinum   Uncontrolled hypertension - Primary   BP Readings from Last  3 Encounters:  04/20/24 (!) 180/112  12/27/23 (!) 158/100  11/14/23 (!) 177/104  States good compliance with blood pressure medications Asymptomatic. Cardiovascular risks associated with uncontrolled hypertension discussed Diet and nutrition discussed Recommend to continue valsartan  HCT 320-12.5 mg daily and amlodipine  10 mg daily Recommend to start Bystolic  5 mg daily.  May need to increase dose depending on blood pressure readings at home.  Advised to take blood pressure reading at home daily for the next several weeks and contact the office if numbers persistently abnormal Recommend blood work today and follow-up in  4 weeks       Relevant Medications   nebivolol  (BYSTOLIC ) 5 MG tablet   Other Relevant Orders   Comprehensive metabolic panel with GFR   Hemoglobin A1c   Lipid panel   Testosterone   CBC     Other   Morbid obesity (HCC)   Diet and nutrition discussed Cardiovascular risk associated with obesity discussed Benefits of exercise discussed Advised to decrease amount of daily carbohydrate intake and daily calories and increase amount of plant-based protein in his diet Recently had dose of Zepbound  increased to 10 mg but not tolerating side effects Was doing very well on 7.5 Has been off Zepbound  for 4 weeks Recommend to restart at 5 mg weekly.  Patient has medication at home. Advised to avoid EtOH consumption while on GLP-1 agonist      Patient Instructions  Continue valsartan  HCT and amlodipine  Start Nebivolol  5 mg daily Measure blood pressure reading at home daily for the next several weeks and contact the office if numbers persistently abnormal. Blood work today. Follow-up in 4 weeks.  Earlier as needed  High Blood Pressure (Hypertension) in Adults: What to Know High blood pressure is when the force of blood pumping through your arteries is too strong. This is also called hypertension. Arteries are blood vessels that carry blood from your heart to your  body. If you have high blood pressure, your heart has to work harder to pump blood. This may cause the arteries to get narrow or stiff. High blood pressure can lead to a heart attack, heart failure, a stroke, or even kidney disease. What are the causes? In some cases, the cause isn't known. But there are many health problems that can cause high blood pressure. What increases the risk? You may be more likely to have high blood pressure if: You're older. You have a history of: Heart disease. High cholesterol. Kidney disease. You have a lot of stress in your life. Someone in your family has high blood pressure. You have sleep apnea. You're overweight. You may also be more likely to have high blood pressure if: You smoke. You don't get enough exercise. You eat and drink too much fat, sugar, calories, or salt (sodium). You drink too much alcohol. What are the signs or symptoms? In some cases, you may not have symptoms. But the higher your blood pressure, the more likely you are to have symptoms. These include: Headaches. Fast or uneven heartbeats. Feeling dizzy. Changes to your eyesight. Nosebleeds. Chest pain and feeling short of breath. Throwing up or feeling like you may throw up. How is this diagnosed? You may be diagnosed based on your health history, symptoms, and blood pressure reading. Healthy blood pressure for most adults is <120/80. The first number is the highest pressure reached in your arteries when your heart beats. The second number is the lowest pressure in your arteries when your heart rests between beats. If your blood pressure is above 130/80, you may be told you have high blood pressure. If you have a high blood pressure reading during a visit, you may be asked to: Come back a different day to have your blood pressure checked again. Check your blood pressure at home for 1 week or longer. If you have high blood pressure, you may have blood or imaging tests  done. How is this treated? You may need to: Change your diet. Take medicines to lower your blood pressure. Exercise more. Your target blood pressure will depend on your age and how  healthy you are. Follow these instructions at home: Medicines Take your medicines only as told. Do not skip doses of blood pressure medicine. Tell your provider if you have any side effects from the medicine. Eating and drinking  Eat things that are good for your heart. You may want to try the Dietary Approaches to Stop Hypertension (DASH) diet. To eat this way: Eat lots of fresh fruits and vegetables. Try to fill half of your plate with fruits and vegetables. Fill about a fourth of your plate with whole grains. Whole grains include: Whole-wheat pasta. Brown rice. Whole-grain bread. Eat or drink low-fat dairy products, such as low-fat yogurt or skim milk. Avoid fatty cuts of meat, lunch meats, and poultry with skin. Fill about a fourth of your plate with low-fat (lean) proteins, such as: Fish. Chicken without skin. Beans. Eggs. Tofu. Avoid processed foods. These tend to be higher in salt, added sugar, and fat. Try to eat less than 1,500 mg of salt a day. Do not drink alcohol if: Your health care provider tells you not to drink. You're pregnant, may be pregnant, or plan to become pregnant. If you drink alcohol: Limit how much you have to: 0-1 drink a day if you're male. 0-2 drinks a day if you're male. Know how much alcohol is in your drink. In the [place] one drink is one 12 oz bottle of beer (355 mL), one 5 oz glass of wine (148 mL), or one 1 oz glass of hard liquor (44 mL). Lifestyle  Stay at a healthy body weight. Ask what a good weight is for you. Try to get 90-150 minutes per week of exercise that causes your heart to beat faster. This is called aerobic exercise. This may include: Walking. Swimming. Biking. Include exercises to strengthen your muscles for 30 minutes at least 3 days a  week, such as: Pilates. Lifting weights. General instructions Do not smoke, vape, or use nicotine or tobacco. Check your blood pressure at home as told. Keep all follow-up visits. Your provider will want to check that your blood pressure is coming down. Contact a health care provider if: You have headaches. You feel dizzy. You have eyesight changes. Get help right away if: You have chest, back, or belly pain. You have trouble breathing. You faint. You have any signs of a stroke. BE FAST is an easy way to remember the main warning signs: B - Balance. Feeling dizzy, sudden trouble walking, or loss of balance. E - Eyes. Trouble seeing or a change in how you see. F - Face. Sudden weakness or feeling numb in the face. The face or eyelid may droop on one side. A - Arms. Weakness or loss of feeling in an arm. This happens fast and often only on one side. S - Speech. Sudden trouble speaking, slurred speech, or trouble understanding what people say. T - Time. Time to call 911. Write down what time symptoms started. Other signs of a stroke can be: A sudden, very bad headache with no known cause. Feeling like you may throw up. Throwing up. These symptoms may be an emergency. Call 911 right away. Do not wait to see if the symptoms will go away. Do not drive yourself to the hospital. This information is not intended to replace advice given to you by your health care provider. Make sure you discuss any questions you have with your health care provider. Document Revised: 07/26/2023 Document Reviewed: 07/21/2023 Elsevier Patient Education  2025 [employer].  Emil Schaumann, MD [facility]      [1] Allergies Allergen Reactions   Other Other (See Comments)    Family history of malignant hyperthermia under general anesthesia; pt himself has not undergone general anesthesia

## 2024-04-20 NOTE — Patient Instructions (Addendum)
 Continue valsartan  HCT and amlodipine  Start Nebivolol  5 mg daily Measure blood pressure reading at home daily for the next several weeks and contact the office if numbers persistently abnormal. Blood work today. Follow-up in 4 weeks.  Earlier as needed  High Blood Pressure (Hypertension) in Adults: What to Know High blood pressure is when the force of blood pumping through your arteries is too strong. This is also called hypertension. Arteries are blood vessels that carry blood from your heart to your body. If you have high blood pressure, your heart has to work harder to pump blood. This may cause the arteries to get narrow or stiff. High blood pressure can lead to a heart attack, heart failure, a stroke, or even kidney disease. What are the causes? In some cases, the cause isn't known. But there are many health problems that can cause high blood pressure. What increases the risk? You may be more likely to have high blood pressure if: You're older. You have a history of: Heart disease. High cholesterol. Kidney disease. You have a lot of stress in your life. Someone in your family has high blood pressure. You have sleep apnea. You're overweight. You may also be more likely to have high blood pressure if: You smoke. You don't get enough exercise. You eat and drink too much fat, sugar, calories, or salt (sodium). You drink too much alcohol. What are the signs or symptoms? In some cases, you may not have symptoms. But the higher your blood pressure, the more likely you are to have symptoms. These include: Headaches. Fast or uneven heartbeats. Feeling dizzy. Changes to your eyesight. Nosebleeds. Chest pain and feeling short of breath. Throwing up or feeling like you may throw up. How is this diagnosed? You may be diagnosed based on your health history, symptoms, and blood pressure reading. Healthy blood pressure for most adults is <120/80. The first number is the highest pressure  reached in your arteries when your heart beats. The second number is the lowest pressure in your arteries when your heart rests between beats. If your blood pressure is above 130/80, you may be told you have high blood pressure. If you have a high blood pressure reading during a visit, you may be asked to: Come back a different day to have your blood pressure checked again. Check your blood pressure at home for 1 week or longer. If you have high blood pressure, you may have blood or imaging tests done. How is this treated? You may need to: Change your diet. Take medicines to lower your blood pressure. Exercise more. Your target blood pressure will depend on your age and how healthy you are. Follow these instructions at home: Medicines Take your medicines only as told. Do not skip doses of blood pressure medicine. Tell your provider if you have any side effects from the medicine. Eating and drinking  Eat things that are good for your heart. You may want to try the Dietary Approaches to Stop Hypertension (DASH) diet. To eat this way: Eat lots of fresh fruits and vegetables. Try to fill half of your plate with fruits and vegetables. Fill about a fourth of your plate with whole grains. Whole grains include: Whole-wheat pasta. Brown rice. Whole-grain bread. Eat or drink low-fat dairy products, such as low-fat yogurt or skim milk. Avoid fatty cuts of meat, lunch meats, and poultry with skin. Fill about a fourth of your plate with low-fat (lean) proteins, such as: Fish. Chicken without skin. Beans. Eggs. Tofu. Avoid processed  foods. These tend to be higher in salt, added sugar, and fat. Try to eat less than 1,500 mg of salt a day. Do not drink alcohol if: Your health care provider tells you not to drink. You're pregnant, may be pregnant, or plan to become pregnant. If you drink alcohol: Limit how much you have to: 0-1 drink a day if you're male. 0-2 drinks a day if you're  male. Know how much alcohol is in your drink. In the [place] one drink is one 12 oz bottle of beer (355 mL), one 5 oz glass of wine (148 mL), or one 1 oz glass of hard liquor (44 mL). Lifestyle  Stay at a healthy body weight. Ask what a good weight is for you. Try to get 90-150 minutes per week of exercise that causes your heart to beat faster. This is called aerobic exercise. This may include: Walking. Swimming. Biking. Include exercises to strengthen your muscles for 30 minutes at least 3 days a week, such as: Pilates. Lifting weights. General instructions Do not smoke, vape, or use nicotine or tobacco. Check your blood pressure at home as told. Keep all follow-up visits. Your provider will want to check that your blood pressure is coming down. Contact a health care provider if: You have headaches. You feel dizzy. You have eyesight changes. Get help right away if: You have chest, back, or belly pain. You have trouble breathing. You faint. You have any signs of a stroke. BE FAST is an easy way to remember the main warning signs: B - Balance. Feeling dizzy, sudden trouble walking, or loss of balance. E - Eyes. Trouble seeing or a change in how you see. F - Face. Sudden weakness or feeling numb in the face. The face or eyelid may droop on one side. A - Arms. Weakness or loss of feeling in an arm. This happens fast and often only on one side. S - Speech. Sudden trouble speaking, slurred speech, or trouble understanding what people say. T - Time. Time to call 911. Write down what time symptoms started. Other signs of a stroke can be: A sudden, very bad headache with no known cause. Feeling like you may throw up. Throwing up. These symptoms may be an emergency. Call 911 right away. Do not wait to see if the symptoms will go away. Do not drive yourself to the hospital. This information is not intended to replace advice given to you by your health care provider. Make sure you discuss  any questions you have with your health care provider. Document Revised: 07/26/2023 Document Reviewed: 07/21/2023 Elsevier Patient Education  2025 [employer].

## 2024-04-21 ENCOUNTER — Other Ambulatory Visit (HOSPITAL_COMMUNITY): Payer: Self-pay

## 2024-04-21 ENCOUNTER — Ambulatory Visit: Payer: Self-pay | Admitting: Emergency Medicine

## 2024-04-21 ENCOUNTER — Telehealth: Payer: Self-pay

## 2024-04-21 DIAGNOSIS — E291 Testicular hypofunction: Secondary | ICD-10-CM

## 2024-04-21 LAB — LIPID PANEL
Cholesterol: 212 mg/dL — ABNORMAL HIGH (ref 28–200)
HDL: 39.7 mg/dL
LDL Cholesterol: 125 mg/dL — ABNORMAL HIGH (ref 10–99)
NonHDL: 172.7
Total CHOL/HDL Ratio: 5
Triglycerides: 241 mg/dL — ABNORMAL HIGH (ref 10.0–149.0)
VLDL: 48.2 mg/dL — ABNORMAL HIGH (ref 0.0–40.0)

## 2024-04-21 LAB — TESTOSTERONE: Testosterone: 222.93 ng/dL — ABNORMAL LOW (ref 300.00–890.00)

## 2024-04-21 LAB — COMPREHENSIVE METABOLIC PANEL WITH GFR
ALT: 33 U/L (ref 3–53)
AST: 28 U/L (ref 5–37)
Albumin: 4.4 g/dL (ref 3.5–5.2)
Alkaline Phosphatase: 66 U/L (ref 39–117)
BUN: 10 mg/dL (ref 6–23)
CO2: 28 meq/L (ref 19–32)
Calcium: 9.1 mg/dL (ref 8.4–10.5)
Chloride: 101 meq/L (ref 96–112)
Creatinine, Ser: 1.09 mg/dL (ref 0.40–1.50)
GFR: 82.78 mL/min
Glucose, Bld: 84 mg/dL (ref 70–99)
Potassium: 3.9 meq/L (ref 3.5–5.1)
Sodium: 140 meq/L (ref 135–145)
Total Bilirubin: 0.3 mg/dL (ref 0.2–1.2)
Total Protein: 7.2 g/dL (ref 6.0–8.3)

## 2024-04-21 NOTE — Telephone Encounter (Signed)
 Pharmacy Patient Advocate Encounter   Received notification from RX Request Messages that prior authorization for Zepbound  5mg /0.16ml is required/requested.   Insurance verification completed.   The patient is insured through [facility]   Per test claim: Refill too soon. PA is not needed at this time. Medication was filled 04/13/24. Next eligible fill date is 05/04/24.
